# Patient Record
Sex: Male | Born: 1945 | Race: White | Hispanic: No | Smoking: Never smoker
Health system: Southern US, Community
[De-identification: ages and names within clinical notes are randomized; demographics above are authoritative.]

## PROBLEM LIST (undated history)

## (undated) DIAGNOSIS — E119 Type 2 diabetes mellitus without complications: Secondary | ICD-10-CM

## (undated) DIAGNOSIS — L219 Seborrheic dermatitis, unspecified: Secondary | ICD-10-CM

## (undated) DIAGNOSIS — E785 Hyperlipidemia, unspecified: Secondary | ICD-10-CM

## (undated) HISTORY — PX: LEG SURGERY: SHX1003

## (undated) HISTORY — DX: Seborrheic dermatitis, unspecified: L21.9

## (undated) HISTORY — PX: EYE SURGERY: SHX253

## (undated) HISTORY — DX: Type 2 diabetes mellitus without complications: E11.9

## (undated) HISTORY — PX: KNEE SURGERY: SHX244

---

## 2003-01-14 ENCOUNTER — Emergency Department (HOSPITAL_COMMUNITY): Admission: EM | Admit: 2003-01-14 | Discharge: 2003-01-14 | Payer: Self-pay

## 2004-10-21 ENCOUNTER — Emergency Department (HOSPITAL_COMMUNITY): Admission: EM | Admit: 2004-10-21 | Discharge: 2004-10-21 | Payer: Self-pay | Admitting: Family Medicine

## 2012-10-04 DEATH — deceased

## 2014-11-11 DIAGNOSIS — S82873A Displaced pilon fracture of unspecified tibia, initial encounter for closed fracture: Secondary | ICD-10-CM | POA: Insufficient documentation

## 2015-12-26 ENCOUNTER — Ambulatory Visit (INDEPENDENT_AMBULATORY_CARE_PROVIDER_SITE_OTHER): Payer: BLUE CROSS/BLUE SHIELD | Admitting: Family Medicine

## 2015-12-26 ENCOUNTER — Encounter: Payer: Self-pay | Admitting: Family Medicine

## 2015-12-26 VITALS — BP 121/78 | HR 74 | Ht 72.0 in | Wt 238.0 lb

## 2015-12-26 DIAGNOSIS — E1165 Type 2 diabetes mellitus with hyperglycemia: Secondary | ICD-10-CM | POA: Diagnosis not present

## 2015-12-26 DIAGNOSIS — IMO0002 Reserved for concepts with insufficient information to code with codable children: Secondary | ICD-10-CM | POA: Insufficient documentation

## 2015-12-26 DIAGNOSIS — E559 Vitamin D deficiency, unspecified: Secondary | ICD-10-CM | POA: Insufficient documentation

## 2015-12-26 DIAGNOSIS — IMO0001 Reserved for inherently not codable concepts without codable children: Secondary | ICD-10-CM

## 2015-12-26 LAB — COMPREHENSIVE METABOLIC PANEL
ALBUMIN: 4.2 g/dL (ref 3.6–5.1)
ALK PHOS: 83 U/L (ref 40–115)
ALT: 14 U/L (ref 9–46)
AST: 17 U/L (ref 10–35)
BILIRUBIN TOTAL: 0.6 mg/dL (ref 0.2–1.2)
BUN: 19 mg/dL (ref 7–25)
CO2: 28 mmol/L (ref 20–31)
CREATININE: 0.77 mg/dL (ref 0.70–1.25)
Calcium: 9.3 mg/dL (ref 8.6–10.3)
Chloride: 105 mmol/L (ref 98–110)
Glucose, Bld: 183 mg/dL — ABNORMAL HIGH (ref 65–99)
Potassium: 4.4 mmol/L (ref 3.5–5.3)
SODIUM: 141 mmol/L (ref 135–146)
TOTAL PROTEIN: 6.9 g/dL (ref 6.1–8.1)

## 2015-12-26 LAB — POCT UA - MICROALBUMIN
CREATININE, POC: 100 mg/dL
MICROALBUMIN (UR) POC: 10 mg/L

## 2015-12-26 LAB — CBC
HCT: 44.2 % (ref 38.5–50.0)
HEMOGLOBIN: 14.9 g/dL (ref 13.2–17.1)
MCH: 31.2 pg (ref 27.0–33.0)
MCHC: 33.7 g/dL (ref 32.0–36.0)
MCV: 92.7 fL (ref 80.0–100.0)
MPV: 12 fL (ref 7.5–12.5)
Platelets: 230 10*3/uL (ref 140–400)
RBC: 4.77 MIL/uL (ref 4.20–5.80)
RDW: 13.8 % (ref 11.0–15.0)
WBC: 7.9 10*3/uL (ref 3.8–10.8)

## 2015-12-26 LAB — LIPID PANEL
Cholesterol: 160 mg/dL (ref 125–200)
HDL: 44 mg/dL (ref >=40)
LDL CALC: 101 mg/dL (ref <130)
TRIGLYCERIDES: 75 mg/dL (ref <150)
Total CHOL/HDL Ratio: 3.6 Ratio (ref <=5.0)
VLDL: 15 mg/dL (ref <30)

## 2015-12-26 LAB — URIC ACID: Uric Acid, Serum: 4.1 mg/dL (ref 4.0–7.8)

## 2015-12-26 MED ORDER — DAPAGLIFLOZIN PRO-METFORMIN ER 10-1000 MG PO TB24: 1.0000 | ORAL_TABLET | Freq: Every day | ORAL | Status: DC

## 2015-12-26 NOTE — Patient Instructions (Signed)
Thank you for coming in today. Get labs today.  Return in 1-2 months.  We will likely add cholesterol medicines.  Call or go to the emergency room if you get worse, have trouble breathing, have chest pains, or palpitations.   Get diabetic eye exam as well yearly.

## 2015-12-26 NOTE — Assessment & Plan Note (Signed)
Check vitamin D levels today. 

## 2015-12-26 NOTE — Progress Notes (Signed)
Billy Armstrong is a 70 y.o. male who presents to [facility] today for establish care.  1) diabetes: Patient has type 2 diabetes. He notes his last A1c was 9.6 a few months ago. He denies any polyuria or polydipsia. He takes extended-release metformin 500 mg daily. He denies any significant foot numbness but does note hypersensitivity in his feet bilaterally. He gets annual diabetic eye exams last one was done less than a year ago.  2) lipids: Patient has been told he has high cholesterol in the past. He takes 1 g of omega-3 Fish oil over-the-counter daily.  3) health maintenance: Patient notes that he's had a colonoscopy about 8 years ago.   Past Medical History  Diagnosis Date  . Diabetes mellitus without complication [facility])    Past Surgical History  Procedure Laterality Date  . Leg surgery    . Eye surgery    . Knee surgery     Social History  Substance Use Topics  . Smoking status: Never Smoker   . Smokeless tobacco: Not on file  . Alcohol Use: 0.0 oz/week    0 Standard drinks or equivalent per week   family history is not on file.  ROS as above No headache, visual changes, nausea, vomiting, diarrhea, constipation, dizziness, abdominal pain, skin rash, fevers, chills, night sweats, weight loss, swollen lymph nodes, body aches, joint swelling, muscle aches, chest pain, shortness of breath, mood changes, visual or auditory hallucinations.   Medications: Current Outpatient Prescriptions  Medication Sig Dispense Refill  . aspirin 81 MG tablet Take 81 mg by mouth daily.    . Calcium Citrate-Vitamin D (CALCIUM + D PO) Take by mouth.    . Cholecalciferol (D3 ADULT PO) Take by mouth.    Marland Kitchen glucose blood (ONETOUCH VERIO) test strip     . ibuprofen (ADVIL,MOTRIN) 200 MG tablet Take 200 mg by mouth every 6 (six) hours as needed.    . Multiple Vitamins-Minerals (MULTIVITAMIN PO)  Take by mouth.    . Omega-3 Fatty Acids (FISH OIL PO) Take by mouth.    . Dapagliflozin-Metformin HCl ER (XIGDUO XR) 05-999 MG TB24 Take 1 tablet by mouth daily. 30 tablet 1   No current facility-administered medications for this visit.   No Known Allergies   Exam:  BP 121/78 mmHg  Pulse 74  Ht 6' (1.829 m)  Wt 238 lb (107.956 kg)  BMI 32.27 kg/m2 Gen: Well NAD HEENT: EOMI,  MMM Lungs: Normal work of breathing. CTABL Heart: RRR no MRG Abd: NABS, Soft. Nondistended, Nontender Exts: Brisk capillary refill, warm and well perfused.   Diabetic Foot Exam - Simple   Simple Foot Form  Diabetic Foot exam was performed with the following findings:  Yes 12/26/2015 10:10 AM  Visual Inspection  No deformities, no ulcerations, no other skin breakdown bilaterally:  Yes  Sensation Testing  Intact to touch and monofilament testing bilaterally:  Yes  Pulse Check  Posterior Tibialis and Dorsalis pulse intact bilaterally:  Yes  Comments       Results for orders placed or performed in visit on 12/26/15 (from the past 24 hour(s))  POCT UA - Microalbumin     Status: Normal   Collection Time: 12/26/15  9:45 AM  Result Value Ref Range   Microalbumin Ur, POC 10 mg/L   Creatinine, POC 100 mg/dL   Albumin/Creatinine Ratio, Urine, POC <30    No results found.   Please see individual assessment and plan  sections.

## 2015-12-26 NOTE — Assessment & Plan Note (Signed)
Increase to medications to xigduo Xr 05/999. Check basic fasting labs. Return in one to 2 months.

## 2015-12-27 ENCOUNTER — Encounter: Payer: Self-pay | Admitting: Family Medicine

## 2015-12-27 DIAGNOSIS — Z9189 Other specified personal risk factors, not elsewhere classified: Secondary | ICD-10-CM | POA: Insufficient documentation

## 2015-12-27 LAB — LIPID PANEL
CHOLESTEROL: 199 mg/dL (ref 0–200)
HDL: 59 mg/dL (ref 35–70)
LDL CALC: 132 mg/dL
Triglycerides: 38 mg/dL — AB (ref 40–160)

## 2015-12-27 LAB — BASIC METABOLIC PANEL
BUN: 17 mg/dL (ref 4–21)
Creatinine: 1 mg/dL (ref 0.6–1.3)
GLUCOSE: 218 mg/dL
POTASSIUM: 5.3 mmol/L (ref 3.4–5.3)
Sodium: 139 mmol/L (ref 137–147)

## 2015-12-27 LAB — VITAMIN D 25 HYDROXY (VIT D DEFICIENCY, FRACTURES): Vit D, 25-Hydroxy: 28 ng/mL — ABNORMAL LOW (ref 30–100)

## 2015-12-27 LAB — CBC AND DIFFERENTIAL: HEMOGLOBIN: 15.6 g/dL (ref 13.5–17.5)

## 2015-12-27 MED ORDER — ATORVASTATIN CALCIUM 40 MG PO TABS: 40.0000 mg | ORAL_TABLET | Freq: Every day | ORAL | Status: DC

## 2015-12-27 NOTE — Progress Notes (Signed)
Quick Note:  1) Vitamin D deficiency noted. Take 2000 units of vitamin D daily over-the-counter.  2) Your 10 year risk of heart attack and stroke is 25%. I recommend starting lipitor to reduce this risk further. I have called this medicine in to your pharmacy.  3) The labs look good otherwise. ______

## 2015-12-27 NOTE — Addendum Note (Signed)
Addended by: Gregor Hams on: 12/27/2015 07:55 AM   Modules accepted: Orders

## 2015-12-30 ENCOUNTER — Encounter: Payer: Self-pay | Admitting: Family Medicine

## 2015-12-30 DIAGNOSIS — E291 Testicular hypofunction: Secondary | ICD-10-CM | POA: Insufficient documentation

## 2016-01-05 ENCOUNTER — Encounter: Payer: Self-pay | Admitting: Family Medicine

## 2016-01-05 LAB — ALBUMIN: Albumin: 4.2

## 2016-01-05 LAB — TESTOSTERONE, FREE & TOTAL
Magnesium: 1.9
PSA, FREE: 0.83
TESTOSTERONE: 5.9

## 2016-01-05 LAB — LIPID PANEL: VLDL: 8 mg/dL

## 2016-02-15 ENCOUNTER — Other Ambulatory Visit: Payer: Self-pay | Admitting: Family Medicine

## 2016-02-27 ENCOUNTER — Ambulatory Visit (INDEPENDENT_AMBULATORY_CARE_PROVIDER_SITE_OTHER): Payer: BLUE CROSS/BLUE SHIELD | Admitting: Family Medicine

## 2016-02-27 ENCOUNTER — Ambulatory Visit (INDEPENDENT_AMBULATORY_CARE_PROVIDER_SITE_OTHER): Payer: BLUE CROSS/BLUE SHIELD

## 2016-02-27 ENCOUNTER — Encounter: Payer: Self-pay | Admitting: Family Medicine

## 2016-02-27 VITALS — BP 111/71 | HR 65 | Ht 72.0 in | Wt 224.0 lb

## 2016-02-27 DIAGNOSIS — M25572 Pain in left ankle and joints of left foot: Secondary | ICD-10-CM

## 2016-02-27 DIAGNOSIS — E119 Type 2 diabetes mellitus without complications: Secondary | ICD-10-CM | POA: Diagnosis not present

## 2016-02-27 DIAGNOSIS — Z23 Encounter for immunization: Secondary | ICD-10-CM

## 2016-02-27 LAB — POCT GLYCOSYLATED HEMOGLOBIN (HGB A1C): HEMOGLOBIN A1C: 7.2

## 2016-02-27 MED ORDER — ONETOUCH DELICA LANCETS 33G MISC: 1.0000 | Freq: Every day | 12 refills | Status: DC

## 2016-02-27 MED ORDER — ZOSTER VACCINE LIVE 19400 UNT/0.65ML ~~LOC~~ SUSR: 0.6500 mL | Freq: Once | SUBCUTANEOUS | 0 refills | Status: AC

## 2016-02-27 MED ORDER — DICLOFENAC SODIUM 1 % TD GEL: 2.0000 g | Freq: Four times a day (QID) | TRANSDERMAL | 11 refills | Status: DC

## 2016-02-27 MED ORDER — DAPAGLIFLOZIN PRO-METFORMIN ER 10-1000 MG PO TB24: 1.0000 | ORAL_TABLET | Freq: Every day | ORAL | 1 refills | Status: DC

## 2016-02-27 MED ORDER — AMBULATORY NON FORMULARY MEDICATION: 12 refills | Status: DC

## 2016-02-27 NOTE — Progress Notes (Signed)
Billy Armstrong is a 70 y.o. male who presents to [place]: [facility] today for 2 month follow up of diabetes.  Patient reports his blood sugar has ranged from 100-140 when he takes it first thing upon awakening.  He admits to polyuria since starting the Xigduo XR.  He denies polydipsia and numbness and tingling in his feet.    His only complaint today is of left lateral ankle pain that began several weeks ago.  He describes the pain as an ache that is more noticeable at night and improves with ambulation.  He denies known injury.  Denies fever, chills, and unintentional weight loss.    Health maintenance: Patient is due for Hepatitis C screening, tetanus vaccine, Zostavaz, and Pneumovax.  His last foot exam was at his last visit in May.  He is due for an eye exam in the coming months.   Past Medical History:  Diagnosis Date  . Diabetes mellitus without complication [facility])    Past Surgical History:  Procedure Laterality Date  . EYE SURGERY    . KNEE SURGERY    . LEG SURGERY     Social History  Substance Use Topics  . Smoking status: Never Smoker  . Smokeless tobacco: Not on file  . Alcohol use 0.0 oz/week   family history is not on file.  ROS as above:  Medications: Current Outpatient Prescriptions  Medication Sig Dispense Refill  . aspirin 81 MG tablet Take 81 mg by mouth daily.    Marland Kitchen atorvastatin (LIPITOR) 40 MG tablet TAKE 1 TABLET (40 MG TOTAL) BY MOUTH DAILY. 30 tablet 0  . Calcium Citrate-Vitamin D (CALCIUM + D PO) Take by mouth.    . Cholecalciferol (D3 ADULT PO) Take by mouth.    . Dapagliflozin-Metformin HCl ER (XIGDUO XR) 05-999 MG TB24 Take 1 tablet by mouth daily. 90 tablet 1  . glucose blood (ONETOUCH VERIO) test strip     . ibuprofen (ADVIL,MOTRIN) 200 MG tablet Take 200 mg by mouth every 6 (six) hours as needed.    . Multiple Vitamins-Minerals  (MULTIVITAMIN PO) Take by mouth.    . Omega-3 Fatty Acids (FISH OIL PO) Take by mouth.    . AMBULATORY NON FORMULARY MEDICATION One touch verio test strips. Test daily. E11.65 100 each 12  . diclofenac sodium (VOLTAREN) 1 % GEL Apply 2 g topically 4 (four) times daily. To affected joint. 100 g 11  . ONETOUCH DELICA LANCETS 99991111 MISC 1 Device by Does not apply route daily. 100 each 12  . Zoster Vaccine Live, PF, (ZOSTAVAX) 24401 UNT/0.65ML injection Inject 19,400 Units into the skin once. If given in pharmacy fax report to Dr Georgina Snell [phone] 1 each 0   No current facility-administered medications for this visit.    No Known Allergies   Exam:  BP 111/71   Pulse 65   Ht 6' (1.829 m)   Wt 224 lb (101.6 kg)   BMI 30.38 kg/m  Gen: Well NAD Lungs: Normal work of breathing. CTABL Heart: RRR no MRG Exts: Brisk capillary refill, warm and well perfused.  Left ankle: Mild joint effusion  Full range of motion Nontender Sensation intact to light touch.  Dorsalis pedis pulse 2+ Pain with resisted eversion of his foot.  Left ankle Xray: Degenerative changes of the tibiotalar joint without acute bony findings. Awaiting formal radiology review.  Results for orders placed or performed in visit on 02/27/16 (from the past 24 hour(s))  POCT HgB A1C     Status: None   Collection Time: 02/27/16  8:28 AM  Result Value Ref Range   Hemoglobin A1C 7.2    Dg Ankle Complete Left  Result Date: 02/27/2016 CLINICAL DATA:  Left lateral malleolus ankle pain with slight swelling without injury. EXAM: LEFT ANKLE COMPLETE - 3+ VIEW COMPARISON:  None. FINDINGS: No evidence for fracture. No subluxation or dislocation. Degenerative changes are seen at the tibiotalar joint with a prominent os trigonum. Prominent plantar spur arises from the calcaneal tuberosity. IMPRESSION: Degenerative changes without acute bony findings. Electronically Signed   By: Misty Stanley M.D.   On: 02/27/2016 08:43     Assessment and  Plan: 70 y.o. 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  Will treat with diclofenac gel.  Use ice, compression, and tylenol as needed for pain. The ankle pain is a new issue today.  Diabetes: POC A1c of 7.2.  Will continue current medications with a follow up in 3 months.  Continue weight loss.  Health Maintenance: Hepatitis C screening will be done at next appointment with labs.  Given tetanus shot and PCV 13 in the office.  Prescribed Zostovax for patient to fill at pharmacy.    Discussed warning signs or symptoms. Please see discharge instructions. Patient expresses understanding.

## 2016-02-27 NOTE — Patient Instructions (Signed)
Thank you for coming in today. Use the gel and ice and compression and tylenol as needed.  Return in 3 months.  Return sooner if needed.

## 2016-03-13 ENCOUNTER — Other Ambulatory Visit: Payer: Self-pay | Admitting: Family Medicine

## 2016-03-29 ENCOUNTER — Encounter: Payer: Self-pay | Admitting: Family Medicine

## 2016-04-21 ENCOUNTER — Other Ambulatory Visit: Payer: Self-pay | Admitting: Family Medicine

## 2016-05-21 ENCOUNTER — Other Ambulatory Visit: Payer: Self-pay | Admitting: Family Medicine

## 2016-05-29 ENCOUNTER — Ambulatory Visit (INDEPENDENT_AMBULATORY_CARE_PROVIDER_SITE_OTHER): Payer: BLUE CROSS/BLUE SHIELD | Admitting: Family Medicine

## 2016-05-29 VITALS — BP 118/62 | HR 61 | Wt 218.0 lb

## 2016-05-29 DIAGNOSIS — M25572 Pain in left ankle and joints of left foot: Secondary | ICD-10-CM | POA: Diagnosis not present

## 2016-05-29 DIAGNOSIS — E785 Hyperlipidemia, unspecified: Secondary | ICD-10-CM | POA: Insufficient documentation

## 2016-05-29 DIAGNOSIS — E119 Type 2 diabetes mellitus without complications: Secondary | ICD-10-CM

## 2016-05-29 DIAGNOSIS — Z1159 Encounter for screening for other viral diseases: Secondary | ICD-10-CM | POA: Diagnosis not present

## 2016-05-29 DIAGNOSIS — G8929 Other chronic pain: Secondary | ICD-10-CM

## 2016-05-29 LAB — COMPLETE METABOLIC PANEL WITH GFR
ALT: 15 U/L (ref 9–46)
AST: 17 U/L (ref 10–35)
Albumin: 4.4 g/dL (ref 3.6–5.1)
Alkaline Phosphatase: 76 U/L (ref 40–115)
BUN: 25 mg/dL (ref 7–25)
CHLORIDE: 104 mmol/L (ref 98–110)
CO2: 27 mmol/L (ref 20–31)
CREATININE: 0.77 mg/dL (ref 0.70–1.25)
Calcium: 10.1 mg/dL (ref 8.6–10.3)
GFR, Est Non African American: >89 mL/min (ref >=60)
Glucose, Bld: 123 mg/dL — ABNORMAL HIGH (ref 65–99)
POTASSIUM: 4.5 mmol/L (ref 3.5–5.3)
Sodium: 141 mmol/L (ref 135–146)
Total Bilirubin: 0.8 mg/dL (ref 0.2–1.2)
Total Protein: 7 g/dL (ref 6.1–8.1)

## 2016-05-29 LAB — LIPID PANEL
CHOL/HDL RATIO: 2 ratio (ref <=5.0)
CHOLESTEROL: 99 mg/dL — AB (ref 125–200)
HDL: 49 mg/dL (ref >=40)
LDL CALC: 40 mg/dL (ref <130)
TRIGLYCERIDES: 50 mg/dL (ref <150)
VLDL: 10 mg/dL (ref <30)

## 2016-05-29 LAB — CBC
HCT: 44.9 % (ref 38.5–50.0)
Hemoglobin: 15.1 g/dL (ref 13.2–17.1)
MCH: 31.7 pg (ref 27.0–33.0)
MCHC: 33.6 g/dL (ref 32.0–36.0)
MCV: 94.3 fL (ref 80.0–100.0)
MPV: 11.7 fL (ref 7.5–12.5)
PLATELETS: 224 10*3/uL (ref 140–400)
RBC: 4.76 MIL/uL (ref 4.20–5.80)
RDW: 13.8 % (ref 11.0–15.0)
WBC: 7.6 10*3/uL (ref 3.8–10.8)

## 2016-05-29 LAB — POCT GLYCOSYLATED HEMOGLOBIN (HGB A1C): Hemoglobin A1C: 6.7

## 2016-05-29 MED ORDER — ATORVASTATIN CALCIUM 40 MG PO TABS: 40.0000 mg | ORAL_TABLET | Freq: Every day | ORAL | 1 refills | Status: DC

## 2016-05-29 MED ORDER — ZOSTER VACCINE LIVE 19400 UNT/0.65ML ~~LOC~~ SUSR: 0.6500 mL | Freq: Once | SUBCUTANEOUS | 0 refills | Status: AC

## 2016-05-29 NOTE — Progress Notes (Signed)
Adeola Gegg is a 70 y.o. male who presents to [place]: [place] today for follow-up diabetes, hyperlipidemia, and left ankle pain.  Diabetes: Doing excellent with the below regimen. He denies any hyperglycemia or hypoglycemia polyuria or polydipsia. He takes his medications regularly.  Hyperlipidemia: Also doing well with atorvastatin. No chest pain rotation shortness of breath or significant muscle pain.  Left ankle pain: Much better with relative rest and diclofenac gel. His symptoms have improved considerably.   Past Medical History:  Diagnosis Date  . Diabetes mellitus without complication [facility])    Past Surgical History:  Procedure Laterality Date  . EYE SURGERY    . KNEE SURGERY    . LEG SURGERY     Social History  Substance Use Topics  . Smoking status: Never Smoker  . Smokeless tobacco: Not on file  . Alcohol use 0.0 oz/week   family history is not on file.  ROS as above:  Medications: Current Outpatient Prescriptions  Medication Sig Dispense Refill  . AMBULATORY NON FORMULARY MEDICATION One touch verio test strips. Test daily. E11.65 100 each 12  . aspirin 81 MG tablet Take 81 mg by mouth daily.    Marland Kitchen atorvastatin (LIPITOR) 40 MG tablet Take 1 tablet (40 mg total) by mouth daily. 90 tablet 1  . Calcium Citrate-Vitamin D (CALCIUM + D PO) Take by mouth.    . Cholecalciferol (D3 ADULT PO) Take by mouth.    . Dapagliflozin-Metformin HCl ER (XIGDUO XR) 05-999 MG TB24 Take 1 tablet by mouth daily. 90 tablet 1  . diclofenac sodium (VOLTAREN) 1 % GEL Apply 2 g topically 4 (four) times daily. To affected joint. 100 g 11  . glucose blood (ONETOUCH VERIO) test strip     . ibuprofen (ADVIL,MOTRIN) 200 MG tablet Take 200 mg by mouth every 6 (six) hours as needed.    . Multiple Vitamins-Minerals (MULTIVITAMIN PO) Take by mouth.    . Omega-3 Fatty Acids (FISH OIL  PO) Take by mouth.    [name] DELICA LANCETS 99991111 MISC 1 Device by Does not apply route daily. 100 each 12  . Zoster Vaccine Live, PF, (ZOSTAVAX) 16109 UNT/0.65ML injection Inject 19,400 Units into the skin once. If given in pharmacy fax report to Dr Georgina Snell [phone] 1 each 0   No current facility-administered medications for this visit.    No Known Allergies  Health Maintenance Health Maintenance  Topic Date Due  . Hepatitis C Screening  [birth year]  . ZOSTAVAX  08/13/2007  . OPHTHALMOLOGY EXAM  02/04/2016  . INFLUENZA VACCINE  03/06/2016  . HEMOGLOBIN A1C  08/29/2016  . FOOT EXAM  12/25/2016  . URINE MICROALBUMIN  12/25/2016  . PNA vac Low Risk Adult (2 of 2 - PPSV23) 02/26/2017  . COLONOSCOPY  08/06/2017  . TETANUS/TDAP  02/26/2026     Exam:  BP 118/62   Pulse 61   Wt 218 lb (98.9 kg)   BMI 29.57 kg/m  Gen: Well NAD HEENT: EOMI,  MMM Lungs: Normal work of breathing. CTABL Heart: RRR no MRG Abd: NABS, Soft. Nondistended, Nontender Exts: Brisk capillary refill, warm and well perfused.  Ankles bilaterally are nontender. Normal gait.   Results for orders placed or performed in visit on 05/29/16 (from the past 72 hour(s))  POCT HgB A1C     Status: None   Collection Time: 05/29/16  8:40 AM  Result Value Ref Range   Hemoglobin A1C 6.7  No results found.    Assessment and Plan: 70 y.o. male with Diabetes. Excellent control. Continue current regimen.  Hyperlipidemia: Check fasting lipid as well as other fasting labs.  Ankle pain: Doing well continue current regimen.  Health maintenance: Flu vaccine declined. Hepatitis C screening lab order today.   Orders Placed This Encounter  Procedures  . CBC  . COMPLETE METABOLIC PANEL WITH GFR  . Lipid panel  . Hepatitis C antibody  . POCT HgB A1C    Discussed warning signs or symptoms. Please see discharge instructions. Patient expresses understanding.

## 2016-05-29 NOTE — Patient Instructions (Signed)
Thank you for coming in today. You are doing very well.  Please get fasting labs today.  Return in 3 months for recheck or sooner if needed.   Please make sure the eye doctor send a copy of you eye exam to me. Fax [phone]

## 2016-05-30 LAB — HEPATITIS C ANTIBODY: HCV Ab: NEGATIVE

## 2016-06-19 LAB — HM DIABETES EYE EXAM

## 2016-08-06 ENCOUNTER — Emergency Department (INDEPENDENT_AMBULATORY_CARE_PROVIDER_SITE_OTHER)
Admission: EM | Admit: 2016-08-06 | Discharge: 2016-08-06 | Disposition: A | Discharge status: Home / Self Care | Payer: BLUE CROSS/BLUE SHIELD | Source: Home / Self Care | Attending: Family Medicine | Admitting: Family Medicine

## 2016-08-06 ENCOUNTER — Encounter: Payer: Self-pay | Admitting: *Deleted

## 2016-08-06 DIAGNOSIS — J069 Acute upper respiratory infection, unspecified: Secondary | ICD-10-CM | POA: Diagnosis not present

## 2016-08-06 HISTORY — DX: Hyperlipidemia, unspecified: E78.5

## 2016-08-06 LAB — POCT RAPID STREP A (OFFICE): Rapid Strep A Screen: NEGATIVE

## 2016-08-06 MED ORDER — DOXYCYCLINE HYCLATE 100 MG PO CAPS: 100.0000 mg | ORAL_CAPSULE | Freq: Two times a day (BID) | ORAL | 0 refills | Status: DC

## 2016-08-06 MED ORDER — BENZONATATE 200 MG PO CAPS: ORAL_CAPSULE | ORAL | 0 refills | Status: DC

## 2016-08-06 NOTE — Discharge Instructions (Addendum)
Take plain guaifenesin (1200mg  extended release tabs such as Mucinex) twice daily, with plenty of water, for cough and congestion.  May add Pseudoephedrine (30mg , one or two every 4 to 6 hours) for sinus congestion.  Get adequate rest.   May use Afrin nasal spray (or generic oxymetazoline) twice daily for about 5 days and then discontinue.  Also recommend using saline nasal spray several times daily and saline nasal irrigation (AYR is a common brand).  Use Flonase nasal spray each morning after using Afrin nasal spray and saline nasal irrigation. Try warm salt water gargles for sore throat.  Stop all antihistamines for now, and other non-prescription cough/cold preparations. Follow-up with family doctor if not improving about10 days.

## 2016-08-06 NOTE — ED Triage Notes (Signed)
Pt c/o nasal congestion, sinus pressure and sore throat x 3 days. Denies fever.

## 2016-08-06 NOTE — ED Provider Notes (Signed)
[facility]    CSN: [id number] Arrival date & time: 08/06/16  1107     History   Chief Complaint Chief Complaint  Patient presents with  . Nasal Congestion    HPI Billy Armstrong is a 71 y.o. male.   Patient complains of three day history of typical cold-like symptoms including mild sore throat, sinus congestion, headache, fatigue, and cough.  His sore throat became worse today, and he notes that he often coughs until he gags.   The history is provided by the patient.    Past Medical History:  Diagnosis Date  . Diabetes mellitus without complication ([place])   . Hyperlipidemia     Patient Active Problem List   Diagnosis Date Noted  . HLD (hyperlipidemia) 05/29/2016  . Left ankle pain 02/27/2016  . 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 Framingham cardiac risk >20% in next 10 years 12/27/2015  . Diabetes mellitus ([place]) 12/26/2015  . Vitamin D deficiency 12/26/2015  . Fracture of tibial plafond 11/11/2014    Past Surgical History:  Procedure Laterality Date  . EYE SURGERY    . KNEE SURGERY    . LEG SURGERY         Home Medications    Prior to Admission medications   Medication Sig Start Date End Date Taking? Authorizing Provider  AMBULATORY NON FORMULARY MEDICATION One touch verio test strips. Test daily. E11.65 02/27/16   Gregor Hams, MD  aspirin 81 MG tablet Take 81 mg by mouth daily.    Historical Provider, MD  atorvastatin (LIPITOR) 40 MG tablet Take 1 tablet (40 mg total) by mouth daily. 05/29/16   Gregor Hams, MD  benzonatate (TESSALON) 200 MG capsule Take one cap by mouth at bedtime as needed for cough.  May repeat in 4 to 6 hours 08/06/16   Kandra Nicolas, MD  Calcium Citrate-Vitamin D (CALCIUM + D PO) Take by mouth.    Historical Provider, MD  Cholecalciferol (D3 ADULT PO) Take by mouth.    Historical Provider, MD  Dapagliflozin-Metformin HCl ER (XIGDUO XR) 05-999 MG TB24 Take 1 tablet by mouth daily. 02/27/16   Gregor Hams, MD  diclofenac  sodium (VOLTAREN) 1 % GEL Apply 2 g topically 4 (four) times daily. To affected joint. 02/27/16   Gregor Hams, MD  doxycycline (VIBRAMYCIN) 100 MG capsule Take 1 capsule (100 mg total) by mouth 2 (two) times daily. Take with food. 08/06/16   Kandra Nicolas, MD  glucose blood (ONETOUCH VERIO) test strip  10/13/14   Historical Provider, MD  ibuprofen (ADVIL,MOTRIN) 200 MG tablet Take 200 mg by mouth every 6 (six) hours as needed.    Historical Provider, MD  Multiple Vitamins-Minerals (MULTIVITAMIN PO) Take by mouth.    Historical Provider, MD  Omega-3 Fatty Acids (FISH OIL PO) Take by mouth.    Historical Provider, MD  [facility] DELICA LANCETS 99991111 MISC 1 Device by Does not apply route daily. 02/27/16   Gregor Hams, MD    Family History Family History  Problem Relation Age of Onset  . Diabetes Mother   . Cancer Father   . Cancer Sister   . Cancer Brother     Social History Social History  Substance Use Topics  . Smoking status: Never Smoker  . Smokeless tobacco: Never Used  . Alcohol use 0.0 oz/week     Allergies   Patient has no known allergies.   Review of Systems Review of Systems + sore throat + cough  No pleuritic pain No wheezing + nasal congestion + post-nasal drainage No sinus pain/pressure No itchy/red eyes No earache No hemoptysis No SOB No fever, + chills/sweats No nausea No vomiting No abdominal pain No diarrhea No urinary symptoms No skin rash + fatigue No myalgias + headache Used OTC meds without relief   Physical Exam Triage Vital Signs ED Triage Vitals  Enc Vitals Group     BP 08/06/16 1258 110/69     Pulse Rate 08/06/16 1258 64     Resp 08/06/16 1258 18     Temp 08/06/16 1258 97.9 F (36.6 C)     Temp Source 08/06/16 1258 Oral     SpO2 08/06/16 1258 98 %     Weight 08/06/16 1259 220 lb (99.8 kg)     Height 08/06/16 1259 6' (1.829 m)     Head Circumference --      Peak Flow --      Pain Score 08/06/16 1300 0     Pain Loc --      Pain  Edu? --      Excl. in [place]? --    No data found.   Updated Vital Signs BP 110/69 (BP Location: Left Arm)   Pulse 64   Temp 97.9 F (36.6 C) (Oral)   Resp 18   Ht 6' (1.829 m)   Wt 220 lb (99.8 kg)   SpO2 98%   BMI 29.84 kg/m   Visual Acuity Right Eye Distance:   Left Eye Distance:   Bilateral Distance:    Right Eye Near:   Left Eye Near:    Bilateral Near:     Physical Exam Nursing notes and Vital Signs reviewed. Appearance:  Patient appears stated age, and in no acute distress Eyes:  Pupils are equal, round, and reactive to light and accomodation.  Extraocular movement is intact.  Conjunctivae are not inflamed  Ears:  Canals normal.  Tympanic membranes normal.  Nose:  Mildly congested turbinates.  No sinus tenderness.  Pharynx:  Uvula edematous. Neck:  Supple.  Tender enlarged posterior/lateral nodes are palpated bilaterally.  Tonsillar nodes are tender to palpation but not enlarged.  Lungs:  Clear to auscultation.  Breath sounds are equal.  Moving air well. Heart:  Regular rate and rhythm without murmurs, rubs, or gallops.  Abdomen:  Nontender without masses or hepatosplenomegaly.  Bowel sounds are present.  No CVA or flank tenderness.  Extremities:  No edema.  Skin:  No rash present.    UC Treatments / Results  Labs (all labs ordered are listed, but only abnormal results are displayed) Labs Reviewed  POCT RAPID STREP A (OFFICE) negative    EKG  EKG Interpretation None       Radiology No results found.  Procedures Procedures (including critical care time)  Medications Ordered in UC Medications - No data to display   Initial Impression / Assessment and Plan / UC Course  I have reviewed the triage vital signs and the nursing notes.  Pertinent labs & imaging results that were available during my care of the patient were reviewed by me and considered in my medical decision making (see chart for details).  Clinical Course   Cover for atypical  organisms with doxycycline 100mg  BID Prescription written for Benzonatate (Tessalon) to take at bedtime for night-time cough.  Take plain guaifenesin (1200mg  extended release tabs such as Mucinex) twice daily, with plenty of water, for cough and congestion.  May add Pseudoephedrine (30mg , one or two every 4  to 6 hours) for sinus congestion.  Get adequate rest.   May use Afrin nasal spray (or generic oxymetazoline) twice daily for about 5 days and then discontinue.  Also recommend using saline nasal spray several times daily and saline nasal irrigation (AYR is a common brand).  Use Flonase nasal spray each morning after using Afrin nasal spray and saline nasal irrigation. Try warm salt water gargles for sore throat.  Stop all antihistamines for now, and other non-prescription cough/cold preparations. Follow-up with family doctor if not improving about10 days.      Final Clinical Impressions(s) / UC Diagnoses   Final diagnoses:  Acute upper respiratory infection    New Prescriptions New Prescriptions   BENZONATATE (TESSALON) 200 MG CAPSULE    Take one cap by mouth at bedtime as needed for cough.  May repeat in 4 to 6 hours   DOXYCYCLINE (VIBRAMYCIN) 100 MG CAPSULE    Take 1 capsule (100 mg total) by mouth 2 (two) times daily. Take with food.     Kandra Nicolas, MD 08/20/16 [phone]

## 2016-08-28 ENCOUNTER — Ambulatory Visit (INDEPENDENT_AMBULATORY_CARE_PROVIDER_SITE_OTHER): Payer: BLUE CROSS/BLUE SHIELD | Admitting: Family Medicine

## 2016-08-28 ENCOUNTER — Encounter: Payer: Self-pay | Admitting: Family Medicine

## 2016-08-28 VITALS — BP 118/65 | HR 65 | Wt 215.0 lb

## 2016-08-28 DIAGNOSIS — E119 Type 2 diabetes mellitus without complications: Secondary | ICD-10-CM | POA: Diagnosis not present

## 2016-08-28 DIAGNOSIS — E785 Hyperlipidemia, unspecified: Secondary | ICD-10-CM

## 2016-08-28 DIAGNOSIS — E559 Vitamin D deficiency, unspecified: Secondary | ICD-10-CM | POA: Diagnosis not present

## 2016-08-28 LAB — POCT GLYCOSYLATED HEMOGLOBIN (HGB A1C): HEMOGLOBIN A1C: 7.2

## 2016-08-28 NOTE — Progress Notes (Signed)
Billy Armstrong is a 71 y.o. male who presents to [place] today for  Follow-up diabetes, hyperlipidemia, vitamin D deficiency.  Diabetes: Doing well with the below medications. Patient denies any polyuria or polydipsia. He notes he's been less adherent to his normal diet And exercise program over the holidays and thinks perhaps his A1c will be a little worse.  Hyperlipidemia: Patient takes aspirin, Lipitor, and fish oil daily. He denies muscle aches or pains or abdominal pain. Denies any chest pain palpitations or shortness of breath.  Vitamin D deficiency: Patient takes vitamin D daily   Past Medical History:  Diagnosis Date  . Diabetes mellitus without complication ([place])   . Hyperlipidemia    Past Surgical History:  Procedure Laterality Date  . EYE SURGERY    . KNEE SURGERY    . LEG SURGERY     Social History  Substance Use Topics  . Smoking status: Never Smoker  . Smokeless tobacco: Never Used  . Alcohol use 0.0 oz/week   family history includes Cancer in his brother, father, and sister; Diabetes in his mother.  ROS as above:  Medications: Current Outpatient Prescriptions  Medication Sig Dispense Refill  . AMBULATORY NON FORMULARY MEDICATION One touch verio test strips. Test daily. E11.65 100 each 12  . aspirin 81 MG tablet Take 81 mg by mouth daily.    Marland Kitchen atorvastatin (LIPITOR) 40 MG tablet Take 1 tablet (40 mg total) by mouth daily. 90 tablet 1  . Calcium Citrate-Vitamin D (CALCIUM + D PO) Take by mouth.    . Cholecalciferol (D3 ADULT PO) Take by mouth.    . Dapagliflozin-Metformin HCl ER (XIGDUO XR) 05-999 MG TB24 Take 1 tablet by mouth daily. 90 tablet 1  . diclofenac sodium (VOLTAREN) 1 % GEL Apply 2 g topically 4 (four) times daily. To affected joint. 100 g 11  . glucose blood (ONETOUCH VERIO) test strip     . ibuprofen (ADVIL,MOTRIN) 200  MG tablet Take 200 mg by mouth every 6 (six) hours as needed.    . Multiple Vitamins-Minerals (MULTIVITAMIN PO) Take by mouth.    . Omega-3 Fatty Acids (FISH OIL PO) Take by mouth.    [name] DELICA LANCETS 99991111 MISC 1 Device by Does not apply route daily. 100 each 12   No current facility-administered medications for this visit.    No Known Allergies  Health Maintenance Health Maintenance  Topic Date Due  . OPHTHALMOLOGY EXAM  02/04/2016  . INFLUENZA VACCINE  04/06/2029 (Originally 03/06/2016)  . FOOT EXAM  12/25/2016  . URINE MICROALBUMIN  12/25/2016  . HEMOGLOBIN A1C  02/25/2017  . PNA vac Low Risk Adult (2 of 2 - PPSV23) 02/26/2017  . COLONOSCOPY  08/06/2017  . TETANUS/TDAP  02/26/2026  . ZOSTAVAX  Completed  . Hepatitis C Screening  Completed     Exam:  BP 118/65   Pulse 65   Wt 215 lb (97.5 kg)   SpO2 99%   BMI 29.16 kg/m  Gen: Well NAD HEENT: EOMI,  MMM Lungs: Normal work of breathing. CTABL Heart: RRR no MRG Abd: NABS, Soft. Nondistended, Nontender Exts: Brisk capillary refill, warm and well perfused. Varicosities present bilaterally   Results for orders placed or performed in visit on 08/28/16 (from the past 72 hour(s))  POCT HgB A1C     Status: None   Collection Time: 08/28/16  8:18 AM  Result Value Ref Range   Hemoglobin A1C 7.2  No results found.    Assessment and Plan: 72 y.o. male with  Diabetes: Doing well. Patient had diabetic eye exam last fall. Patient will try to be a little more adherent to diet and exercise and will recheck A1c in 3 months.  Hyperlipidemia: Doing very well. Continue current regimen. Recheck in 3 months.  Vitamin D deficiency: Also doing well. Continue current regimen and recheck in 3 months.   Orders Placed This Encounter  Procedures  . POCT HgB A1C    Discussed warning signs or symptoms. Please see discharge instructions. Patient expresses understanding.

## 2016-08-28 NOTE — Patient Instructions (Signed)
Thank you for coming in today. Recheck in 3 months or sooner if needed.  Work on reduced carbs to keep the diabetes under control.

## 2016-09-04 ENCOUNTER — Other Ambulatory Visit: Payer: Self-pay | Admitting: Family Medicine

## 2016-09-18 ENCOUNTER — Encounter: Payer: Self-pay | Admitting: Family Medicine

## 2016-11-26 ENCOUNTER — Ambulatory Visit: Payer: BLUE CROSS/BLUE SHIELD | Admitting: Family Medicine

## 2016-12-03 ENCOUNTER — Ambulatory Visit (INDEPENDENT_AMBULATORY_CARE_PROVIDER_SITE_OTHER): Payer: BLUE CROSS/BLUE SHIELD | Admitting: Family Medicine

## 2016-12-03 VITALS — BP 153/74 | HR 68 | Wt 212.0 lb

## 2016-12-03 DIAGNOSIS — E119 Type 2 diabetes mellitus without complications: Secondary | ICD-10-CM | POA: Diagnosis not present

## 2016-12-03 LAB — POCT UA - MICROALBUMIN

## 2016-12-03 LAB — POCT GLYCOSYLATED HEMOGLOBIN (HGB A1C): HEMOGLOBIN A1C: 7.6

## 2016-12-03 NOTE — Progress Notes (Signed)
Billy Armstrong is a 71 y.o. male who presents to [place]: [facility] today for follow-up diabetes.  He has no problems with his current medications.  He has been measuring his fasting blood glucose at home once a day and notes it is normally above 130.  It used to be below this which concerns him.  He denies polyuria, polydipsia, changes in vision, SOB, palpitations. He has been walking for 20 minutes a day for exercise.     Past Medical History:  Diagnosis Date  . Diabetes mellitus without complication ([place])   . Hyperlipidemia    Past Surgical History:  Procedure Laterality Date  . EYE SURGERY    . KNEE SURGERY    . LEG SURGERY     Social History  Substance Use Topics  . Smoking status: Never Smoker  . Smokeless tobacco: Never Used  . Alcohol use 0.0 oz/week   family history includes Cancer in his brother, father, and sister; Diabetes in his mother.  ROS as above:  Medications: Current Outpatient Prescriptions  Medication Sig Dispense Refill  . AMBULATORY NON FORMULARY MEDICATION One touch verio test strips. Test daily. E11.65 100 each 12  . aspirin 81 MG tablet Take 81 mg by mouth daily.    Marland Kitchen atorvastatin (LIPITOR) 40 MG tablet Take 1 tablet (40 mg total) by mouth daily. 90 tablet 1  . Calcium Citrate-Vitamin D (CALCIUM + D PO) Take by mouth.    . Cholecalciferol (D3 ADULT PO) Take by mouth.    . diclofenac sodium (VOLTAREN) 1 % GEL Apply 2 g topically 4 (four) times daily. To affected joint. 100 g 11  . glucose blood (ONETOUCH VERIO) test strip     . ibuprofen (ADVIL,MOTRIN) 200 MG tablet Take 200 mg by mouth every 6 (six) hours as needed.    . Multiple Vitamins-Minerals (MULTIVITAMIN PO) Take by mouth.    . Omega-3 Fatty Acids (FISH OIL PO) Take by mouth.    [name] DELICA LANCETS 09F MISC 1 Device by Does not apply route daily. 100 each 12  . XIGDUO XR  05-999 MG TB24 TAKE 1 TABLET BY MOUTH DAILY. 90 tablet 1   No current facility-administered medications for this visit.    No Known Allergies  Health Maintenance Health Maintenance  Topic Date Due  . INFLUENZA VACCINE  04/06/2029 (Originally 03/06/2017)  . FOOT EXAM  12/25/2016  . URINE MICROALBUMIN  12/25/2016  . HEMOGLOBIN A1C  02/25/2017  . PNA vac Low Risk Adult (2 of 2 - PPSV23) 02/26/2017  . OPHTHALMOLOGY EXAM  06/19/2017  . COLONOSCOPY  08/06/2017  . TETANUS/TDAP  02/26/2026  . Hepatitis C Screening  Completed     Exam:  BP (!) 153/74   Pulse 68   Wt 212 lb (96.2 kg)   BMI 28.75 kg/m  Gen: Well NAD HEENT: EOMI,  MMM Lungs: Normal work of breathing. CTABL Heart: RRR no MRG Abd: NABS, Soft. Nondistended, Nontender Exts: Brisk capillary refill, warm and well perfused.    Results for orders placed or performed in visit on 12/03/16 (from the past 72 hour(s))  POCT UA - Microalbumin     Status: Normal   Collection Time: 12/03/16  9:12 AM  Result Value Ref Range   Microalbumin Ur, POC 10mg /l mg/L   Creatinine, POC 200mg  /dl mg/dL   Albumin/Creatinine Ratio, Urine, POC 30mg /g   POCT HgB A1C     Status: Abnormal   Collection Time:  12/03/16  9:13 AM  Result Value Ref Range   Hemoglobin A1C 7.6    No results found.    Assessment and Plan: 71 y.o. male with T2DM  Today's A1C was up from last visit, patient was reluctant to take more medication and would like to work on lifestyle changes.  He was advised on limiting carbohydrate intake and counseled on use of online tools for tracking his carb consumption with his blood glucose.  Urine microalbuminuria was in normal range.  His blood pressure was normal today (124/68).  Will consider low dose ACEi if renal protection required in the future.   Follow-up in 3 months.     Orders Placed This Encounter  Procedures  . POCT HgB A1C  . POCT UA - Microalbumin   No orders of the defined types were placed in this  encounter.    Discussed warning signs or symptoms. Please see discharge instructions. Patient expresses understanding.

## 2016-12-03 NOTE — Patient Instructions (Signed)
Thank you for coming in today. Work on reduced carbs.  Recheck in 3 months.  Consider getting Myfitnesspal

## 2016-12-08 ENCOUNTER — Other Ambulatory Visit: Payer: Self-pay | Admitting: Family Medicine

## 2016-12-25 ENCOUNTER — Ambulatory Visit (INDEPENDENT_AMBULATORY_CARE_PROVIDER_SITE_OTHER): Payer: BLUE CROSS/BLUE SHIELD | Admitting: Family Medicine

## 2016-12-25 ENCOUNTER — Encounter: Payer: Self-pay | Admitting: Family Medicine

## 2016-12-25 VITALS — BP 116/64 | HR 70 | Wt 218.0 lb

## 2016-12-25 DIAGNOSIS — K644 Residual hemorrhoidal skin tags: Secondary | ICD-10-CM | POA: Diagnosis not present

## 2016-12-25 MED ORDER — HYDROCORTISONE 2.5 % RE CREA: 1.0000 "application " | TOPICAL_CREAM | Freq: Two times a day (BID) | RECTAL | 2 refills | Status: DC

## 2016-12-25 NOTE — Patient Instructions (Signed)
Thank you for coming in today. Apply the cream 2-3x daily.  Take a warm bath at night.  Return as needed.  Make sure to eat plenty of fiber.    Hemorrhoids Hemorrhoids are swollen veins in and around the rectum or anus. There are two types of hemorrhoids:  Internal hemorrhoids. These occur in the veins that are just inside the rectum. They may poke through to the outside and become irritated and painful.  External hemorrhoids. These occur in the veins that are outside of the anus and can be felt as a painful swelling or hard lump near the anus. Most hemorrhoids do not cause serious problems, and they can be managed with home treatments such as diet and lifestyle changes. If home treatments do not help your symptoms, procedures can be done to shrink or remove the hemorrhoids. What are the causes? This condition is caused by increased pressure in the anal area. This pressure may result from various things, including:  Constipation.  Straining to have a bowel movement.  Diarrhea.  Pregnancy.  Obesity.  Sitting for long periods of time.  Heavy lifting or other activity that causes you to strain.  Anal sex. What are the signs or symptoms? Symptoms of this condition include:  Pain.  Anal itching or irritation.  Rectal bleeding.  Leakage of stool (feces).  Anal swelling.  One or more lumps around the anus. How is this diagnosed? This condition can often be diagnosed through a visual exam. Other exams or tests may also be done, such as:  Examination of the rectal area with a gloved hand (digital rectal exam).  Examination of the anal canal using a small tube (anoscope).  A blood test, if you have lost a significant amount of blood.  A test to look inside the colon (sigmoidoscopy or colonoscopy). How is this treated? This condition can usually be treated at home. However, various procedures may be done if dietary changes, lifestyle changes, and other home treatments do  not help your symptoms. These procedures can help make the hemorrhoids smaller or remove them completely. Some of these procedures involve surgery, and others do not. Common procedures include:  Rubber band ligation. Rubber bands are placed at the base of the hemorrhoids to cut off the blood supply to them.  Sclerotherapy. Medicine is injected into the hemorrhoids to shrink them.  Infrared coagulation. A type of light energy is used to get rid of the hemorrhoids.  Hemorrhoidectomy surgery. The hemorrhoids are surgically removed, and the veins that supply them are tied off.  Stapled hemorrhoidopexy surgery. A circular stapling device is used to remove the hemorrhoids and use staples to cut off the blood supply to them. Follow these instructions at home: Eating and drinking   Eat foods that have a lot of fiber in them, such as whole grains, beans, nuts, fruits, and vegetables. Ask your health care provider about taking products that have added fiber (fiber supplements).  Drink enough fluid to keep your urine clear or pale yellow. Managing pain and swelling   Take warm sitz baths for 20 minutes, 3-4 times a day to ease pain and discomfort.  If directed, apply ice to the affected area. Using ice packs between sitz baths may be helpful.  Put ice in a plastic bag.  Place a towel between your skin and the bag.  Leave the ice on for 20 minutes, 2-3 times a day. General instructions   Take over-the-counter and prescription medicines only as told by your health  care provider.  Use medicated creams or suppositories as told.  Exercise regularly.  Go to the bathroom when you have the urge to have a bowel movement. Do not wait.  Avoid straining to have bowel movements.  Keep the anal area dry and clean. Use wet toilet paper or moist towelettes after a bowel movement.  Do not sit on the toilet for long periods of time. This increases blood pooling and pain. Contact a health care provider  if:  You have increasing pain and swelling that are not controlled by treatment or medicine.  You have uncontrolled bleeding.  You have difficulty having a bowel movement, or you are unable to have a bowel movement.  You have pain or inflammation outside the area of the hemorrhoids. This information is not intended to replace advice given to you by your health care provider. Make sure you discuss any questions you have with your health care provider. Document Released: 07/20/2000 Document Revised: 12/21/2015 Document Reviewed: 04/06/2015 Elsevier Interactive Patient Education  2017 [employer].

## 2016-12-25 NOTE — Progress Notes (Signed)
Billy Armstrong is a 71 y.o. male who presents to [facility]: [facility] today for hemorrhoid. Patient has a painful itchy firm hemorrhoid present for about a week. He notes it bleed some. He's been using over-the-counter hemorrhoid cream. He denies any fevers or chills vomiting or diarrhea. Sometimes he has hard stools. He does not get hemorrhoids Frequently.    Past Medical History:  Diagnosis Date  . Diabetes mellitus without complication ([place])   . Hyperlipidemia    Past Surgical History:  Procedure Laterality Date  . EYE SURGERY    . KNEE SURGERY    . LEG SURGERY     Social History  Substance Use Topics  . Smoking status: Never Smoker  . Smokeless tobacco: Never Used  . Alcohol use 0.0 oz/week   family history includes Cancer in his brother, father, and sister; Diabetes in his mother.  ROS as above:  Medications: Current Outpatient Prescriptions  Medication Sig Dispense Refill  . AMBULATORY NON FORMULARY MEDICATION One touch verio test strips. Test daily. E11.65 100 each 12  . aspirin 81 MG tablet Take 81 mg by mouth daily.    Marland Kitchen atorvastatin (LIPITOR) 40 MG tablet TAKE 1 TABLET EVERY DAY 90 tablet 1  . Calcium Citrate-Vitamin D (CALCIUM + D PO) Take by mouth.    . Cholecalciferol (D3 ADULT PO) Take by mouth.    . diclofenac sodium (VOLTAREN) 1 % GEL Apply 2 g topically 4 (four) times daily. To affected joint. 100 g 11  . glucose blood (ONETOUCH VERIO) test strip     . ibuprofen (ADVIL,MOTRIN) 200 MG tablet Take 200 mg by mouth every 6 (six) hours as needed.    . Multiple Vitamins-Minerals (MULTIVITAMIN PO) Take by mouth.    . Omega-3 Fatty Acids (FISH OIL PO) Take by mouth.    [name] DELICA LANCETS 16X MISC 1 Device by Does not apply route daily. 100 each 12  . XIGDUO XR 05-999 MG TB24 TAKE 1 TABLET BY MOUTH DAILY. 90 tablet 1  . hydrocortisone (ANUSOL-HC)  2.5 % rectal cream Place 1 application rectally 2 (two) times daily. 30 g 2   No current facility-administered medications for this visit.    No Known Allergies  Health Maintenance Health Maintenance  Topic Date Due  . FOOT EXAM  12/25/2016  . INFLUENZA VACCINE  04/06/2029 (Originally 03/06/2017)  . PNA vac Low Risk Adult (2 of 2 - PPSV23) 02/26/2017  . HEMOGLOBIN A1C  06/04/2017  . OPHTHALMOLOGY EXAM  06/19/2017  . COLONOSCOPY  08/06/2017  . URINE MICROALBUMIN  12/03/2017  . TETANUS/TDAP  02/26/2026  . Hepatitis C Screening  Completed     Exam:  BP 116/64   Pulse 70   Wt 218 lb (98.9 kg)   BMI 29.57 kg/m  Gen: Well NAD HEENT: EOMI,  MMM Lungs: Normal work of breathing. CTABL Heart: RRR no MRG Abd: NABS, Soft. Nondistended, Nontender Exts: Brisk capillary refill, warm and well perfused.  Rectum: Small thrombosed mildly tender external hemorrhoid present. Otherwise normal-appearing   No results found for this or any previous visit (from the past 72 hour(s)). No results found.    Assessment and Plan: 71 y.o. male with thrombosed external hemorrhoid. Patient is outside the window for excision. Plan for Anusol cream watchful waiting as sitz baths. Recommend stool softener.  No orders of the defined types were placed in this encounter.  Meds ordered this encounter  Medications  . hydrocortisone (  ANUSOL-HC) 2.5 % rectal cream    Sig: Place 1 application rectally 2 (two) times daily.    Dispense:  30 g    Refill:  2     Discussed warning signs or symptoms. Please see discharge instructions. Patient expresses understanding.

## 2017-02-24 ENCOUNTER — Other Ambulatory Visit: Payer: Self-pay | Admitting: Family Medicine

## 2017-03-11 ENCOUNTER — Ambulatory Visit (INDEPENDENT_AMBULATORY_CARE_PROVIDER_SITE_OTHER): Payer: BLUE CROSS/BLUE SHIELD | Admitting: Family Medicine

## 2017-03-11 ENCOUNTER — Encounter: Payer: Self-pay | Admitting: Family Medicine

## 2017-03-11 VITALS — BP 106/68 | HR 62 | Ht 72.0 in | Wt 210.0 lb

## 2017-03-11 DIAGNOSIS — E663 Overweight: Secondary | ICD-10-CM

## 2017-03-11 DIAGNOSIS — E119 Type 2 diabetes mellitus without complications: Secondary | ICD-10-CM | POA: Diagnosis not present

## 2017-03-11 DIAGNOSIS — R0981 Nasal congestion: Secondary | ICD-10-CM

## 2017-03-11 DIAGNOSIS — Z23 Encounter for immunization: Secondary | ICD-10-CM | POA: Diagnosis not present

## 2017-03-11 LAB — POCT GLYCOSYLATED HEMOGLOBIN (HGB A1C): Hemoglobin A1C: 7.1

## 2017-03-11 MED ORDER — FLUTICASONE PROPIONATE 50 MCG/ACT NA SUSP: 2.0000 | Freq: Every day | NASAL | 2 refills | Status: DC

## 2017-03-11 MED ORDER — DAPAGLIFLOZIN PRO-METFORMIN ER 10-1000 MG PO TB24: 1.0000 | ORAL_TABLET | Freq: Every day | ORAL | 3 refills | Status: DC

## 2017-03-11 MED ORDER — ATORVASTATIN CALCIUM 40 MG PO TABS: 40.0000 mg | ORAL_TABLET | Freq: Every day | ORAL | 3 refills | Status: DC

## 2017-03-11 NOTE — Patient Instructions (Signed)
Thank you for coming in today. Recheck in 4-5 months for diabetes follow up.  We will likely get fasting labs then.

## 2017-03-11 NOTE — Progress Notes (Signed)
Billy Armstrong is a 71 y.o. male who presents to [name]: [name] today for follow-up of diabetes management.   Diabetes: Patient has continued making dietary adjustments, including decreasing his carbohydrate intake. He has not noticed any decreased sensation in his feet. Patient reports no problems with balance. Patient denies any recent changes in urinary frequency.   Sinus congestion: Patient reports sinus congestion for the last few months. He reports that these symptoms are worse on the right and worse when laying down at night. Patient reports that congestion occasionally wakes him up at night. Patient has tried benadryl which has helped improve symptoms. He also reports using saline drops, but is unsure whether these have improved his symptoms. He has not tried Claritin or Flonase. Patient denies any cough, fever, myalgias, or arthralgias.   Hemorrhoids: Patient reports that these have resolved with use of hydrocortisone cream and sitz baths.    Past Medical History:  Diagnosis Date  . Diabetes mellitus without complication ([place])   . Hyperlipidemia    Past Surgical History:  Procedure Laterality Date  . EYE SURGERY    . KNEE SURGERY    . LEG SURGERY     Social History  Substance Use Topics  . Smoking status: Never Smoker  . Smokeless tobacco: Never Used  . Alcohol use 0.0 oz/week   family history includes Cancer in his brother, father, and sister; Diabetes in his mother.  ROS as above:  Medications: Current Outpatient Prescriptions  Medication Sig Dispense Refill  . AMBULATORY NON FORMULARY MEDICATION One touch verio test strips. Test daily. E11.65 100 each 12  . aspirin 81 MG tablet Take 81 mg by mouth daily.    Marland Kitchen atorvastatin (LIPITOR) 40 MG tablet Take 1 tablet (40 mg total) by mouth daily. 90 tablet 3  . Calcium Citrate-Vitamin D (CALCIUM + D PO) Take  by mouth.    . Cholecalciferol (D3 ADULT PO) Take by mouth.    . Dapagliflozin-Metformin HCl ER (XIGDUO XR) 05-999 MG TB24 Take 1 tablet by mouth daily. 90 tablet 3  . diclofenac sodium (VOLTAREN) 1 % GEL Apply 2 g topically 4 (four) times daily. To affected joint. 100 g 11  . glucose blood (ONETOUCH VERIO) test strip     . hydrocortisone (ANUSOL-HC) 2.5 % rectal cream Place 1 application rectally 2 (two) times daily. 30 g 2  . ibuprofen (ADVIL,MOTRIN) 200 MG tablet Take 200 mg by mouth every 6 (six) hours as needed.    . Multiple Vitamins-Minerals (MULTIVITAMIN PO) Take by mouth.    . Omega-3 Fatty Acids (FISH OIL PO) Take by mouth.    [name] DELICA LANCETS 66Q MISC 1 Device by Does not apply route daily. 100 each 12  . fluticasone (FLONASE) 50 MCG/ACT nasal spray Place 2 sprays into both nostrils daily. 16 g 2   No current facility-administered medications for this visit.    No Known Allergies  Health Maintenance Health Maintenance  Topic Date Due  . INFLUENZA VACCINE  04/06/2029 (Originally 03/06/2017)  . OPHTHALMOLOGY EXAM  06/19/2017  . COLONOSCOPY  08/06/2017  . HEMOGLOBIN A1C  09/11/2017  . URINE MICROALBUMIN  12/03/2017  . FOOT EXAM  03/11/2018  . TETANUS/TDAP  02/26/2026  . Hepatitis C Screening  Completed  . PNA vac Low Risk Adult  Completed     Exam:  BP 106/68   Pulse 62   Ht 6' (1.829 m)   Wt 210 lb (95.3  kg)   BMI 28.48 kg/m  Gen: Well NAD HEENT: EOMI,  MMM, mild clear nasal discharge appreciated, no erythema or exudates seen in throat Lungs: Normal work of breathing. CTABL Heart: RRR, normal S1 and S2, no MRG Abd: NABS, Soft. Nondistended, Nontender Exts: Brisk capillary refill, warm and well perfused.  Foot exam: No ulcers or gross deformity present on inspection Dorsalis pedis pulses 2+ bilaterally, posterior tibialis pulses difficult to appreciate on exam No decreased sensation with monofilament testing No decreased proprioception    Results for  orders placed or performed in visit on 03/11/17 (from the past 72 hour(s))  POCT HgB A1C     Status: None   Collection Time: 03/11/17  8:37 AM  Result Value Ref Range   Hemoglobin A1C 7.1    No results found.    Assessment and Plan: 71 y.o. male seen for follow-up of diabetes management. Patient continues to make appropriate dietary changes, including decreasing his carbohydrate intake. He has lost 8 lbs since his last visit. His hemoglobin A1c is 7.1% today. He should continue the lifestyle modifications he has been making.   For nasal congestion, patient was instructed to try Flonase nasal spray. Patient was encouraged to follow-up in clinic if symptoms worsen or if he develops new symptoms such as fever.   Patient received pneumococcal polysaccharide-23 in clinic today. Patient will follow-up in 4-5 months or sooner if needed.    Orders Placed This Encounter  Procedures  . Pneumococcal polysaccharide vaccine 23-valent greater than or equal to 2yo subcutaneous/IM  . POCT HgB A1C   Meds ordered this encounter  Medications  . atorvastatin (LIPITOR) 40 MG tablet    Sig: Take 1 tablet (40 mg total) by mouth daily.    Dispense:  90 tablet    Refill:  3  . Dapagliflozin-Metformin HCl ER (XIGDUO XR) 05-999 MG TB24    Sig: Take 1 tablet by mouth daily.    Dispense:  90 tablet    Refill:  3  . fluticasone (FLONASE) 50 MCG/ACT nasal spray    Sig: Place 2 sprays into both nostrils daily.    Dispense:  16 g    Refill:  2     Discussed warning signs or symptoms. Please see discharge instructions. Patient expresses understanding.

## 2017-05-14 ENCOUNTER — Other Ambulatory Visit: Payer: Self-pay | Admitting: Family Medicine

## 2017-05-17 ENCOUNTER — Other Ambulatory Visit: Payer: Self-pay | Admitting: Family Medicine

## 2017-06-24 DIAGNOSIS — H43813 Vitreous degeneration, bilateral: Secondary | ICD-10-CM | POA: Insufficient documentation

## 2017-06-24 DIAGNOSIS — E119 Type 2 diabetes mellitus without complications: Secondary | ICD-10-CM | POA: Insufficient documentation

## 2017-06-24 LAB — HM DIABETES EYE EXAM

## 2017-07-08 ENCOUNTER — Encounter: Payer: Self-pay | Admitting: Family Medicine

## 2017-07-08 ENCOUNTER — Ambulatory Visit (INDEPENDENT_AMBULATORY_CARE_PROVIDER_SITE_OTHER): Payer: BLUE CROSS/BLUE SHIELD

## 2017-07-08 ENCOUNTER — Ambulatory Visit: Payer: BLUE CROSS/BLUE SHIELD | Admitting: Family Medicine

## 2017-07-08 VITALS — BP 96/65 | HR 65 | Wt 214.0 lb

## 2017-07-08 DIAGNOSIS — E559 Vitamin D deficiency, unspecified: Secondary | ICD-10-CM

## 2017-07-08 DIAGNOSIS — E119 Type 2 diabetes mellitus without complications: Secondary | ICD-10-CM | POA: Diagnosis not present

## 2017-07-08 DIAGNOSIS — Z1211 Encounter for screening for malignant neoplasm of colon: Secondary | ICD-10-CM | POA: Diagnosis not present

## 2017-07-08 DIAGNOSIS — R0789 Other chest pain: Secondary | ICD-10-CM

## 2017-07-08 DIAGNOSIS — E782 Mixed hyperlipidemia: Secondary | ICD-10-CM | POA: Diagnosis not present

## 2017-07-08 LAB — POCT GLYCOSYLATED HEMOGLOBIN (HGB A1C): Hemoglobin A1C: 7.5

## 2017-07-08 MED ORDER — AZELASTINE HCL 0.1 % NA SOLN: 2.0000 | Freq: Two times a day (BID) | NASAL | 12 refills | Status: DC

## 2017-07-08 MED ORDER — ONETOUCH DELICA LANCETS 33G MISC: 1.0000 | Freq: Every day | 3 refills | Status: DC

## 2017-07-08 MED ORDER — FLUTICASONE PROPIONATE 50 MCG/ACT NA SUSP: 2.0000 | Freq: Every day | NASAL | 2 refills | Status: DC

## 2017-07-08 NOTE — Patient Instructions (Addendum)
Thank you for coming in today. You should hear from [facility] center about colon cancer screening.  For diabetes please work on lower carb diet and increase exercise.  Recheck in 3 months.  Next step would Trulicity.   I think you have a trapezius spasm.  Get xray today to evaluate it.  Work on home exercises and use a heating pad.  See attached handout.  If not better let me know and I will order PT.   Try astelin nasal spray.  If not better let me know and I will refer to ENT.

## 2017-07-08 NOTE — Progress Notes (Signed)
Billy Armstrong is a 71 y.o. male who presents to [facility] today for diabetes follow up, and annual exam. Patient is overall doing well. Complains of shoulder pain, concern of weight gain, and running nose.  Diabetes: A1C 7.5, up from 7.1 last. Some weight gain from prior. Patient exercises frequently. Will try to watch diet around holidays and is confident he will get back on track after holidays. Discussed adding long acting injectable, but will maintain course for now.   Shoulder pain: patient has nagging left shoulder injury. Made worse by lifting heavy objects. He thinks he hurt arm 3 months ago while helping friend move. Feels that it is a nagging baseline pain. Motion doesn't necessarily make pain worse. Has tried ibuprofen with minimal relief. Patient has some ipsilateral lid lag. Low concern for pancoast, but with occupational dust exposure, some concern.   Nose congestion: left sided nose congestion. Flonase did not help. States that is is worse at night with lying on left side. Willing to try additional nasal spray, histamine pill before ENT consult.   Health maintenance: colon cancer screening due next year. Normal last time.   Past Medical History:  Diagnosis Date  . Diabetes mellitus without complication ([place])   . Hyperlipidemia    Past Surgical History:  Procedure Laterality Date  . EYE SURGERY    . KNEE SURGERY    . LEG SURGERY     Social History   Tobacco Use  . Smoking status: Never Smoker  . Smokeless tobacco: Never Used  Substance Use Topics  . Alcohol use: Yes    Alcohol/week: 0.0 oz   family history includes Cancer in his brother, father, and sister; Diabetes in his mother.  ROS as above:  Medications: Current Outpatient Medications  Medication Sig Dispense Refill  . AMBULATORY NON FORMULARY MEDICATION One touch verio test strips.  Test daily. E11.65 100 each 12  . aspirin 81 MG tablet Take 81 mg by mouth daily.    Marland Kitchen atorvastatin (LIPITOR) 40 MG tablet Take 1 tablet (40 mg total) by mouth daily. 90 tablet 3  . Calcium Citrate-Vitamin D (CALCIUM + D PO) Take by mouth.    . Cholecalciferol (D3 ADULT PO) Take by mouth.    . Dapagliflozin-Metformin HCl ER (XIGDUO XR) 05-999 MG TB24 Take 1 tablet by mouth daily. 90 tablet 3  . diclofenac sodium (VOLTAREN) 1 % GEL Apply 2 g topically 4 (four) times daily. To affected joint. 100 g 11  . fluticasone (FLONASE) 50 MCG/ACT nasal spray Place 2 sprays into both nostrils daily. 16 g 2  . hydrocortisone (ANUSOL-HC) 2.5 % rectal cream Place 1 application rectally 2 (two) times daily. 30 g 2  . ibuprofen (ADVIL,MOTRIN) 200 MG tablet Take 200 mg by mouth every 6 (six) hours as needed.    . Multiple Vitamins-Minerals (MULTIVITAMIN PO) Take by mouth.    . Omega-3 Fatty Acids (FISH OIL PO) Take by mouth.    [name] DELICA LANCETS 79G MISC 1 Device by Does not apply route daily. 100 each 3  . ONETOUCH VERIO test strip TEST DAILY 100 each 4  . azelastine (ASTELIN) 0.1 % nasal spray Place 2 sprays into both nostrils 2 (two) times daily. Use in each nostril as directed 30 mL 12   No current facility-administered medications for this visit.    No Known Allergies  Health Maintenance Health Maintenance  Topic Date Due  . OPHTHALMOLOGY EXAM  06/19/2017  . INFLUENZA VACCINE  04/06/2029 (Originally 03/06/2017)  . COLONOSCOPY  08/06/2017  . HEMOGLOBIN A1C  09/11/2017  . URINE MICROALBUMIN  12/03/2017  . FOOT EXAM  03/11/2018  . TETANUS/TDAP  02/26/2026  . Hepatitis C Screening  Completed  . PNA vac Low Risk Adult  Completed     Exam:  BP 96/65   Pulse 65   Wt 214 lb (97.1 kg)   BMI 29.02 kg/m  Gen: Well NAD HEENT: EOMI,  MMM Lungs: Normal work of breathing. CTABL Heart: RRR no MRG Abd: NABS, Soft. Nondistended, Nontender Exts: Brisk capillary refill, warm and well perfused.    MSK: Cspine non-tender to midline.  No masses felt.  Normal neck ROM.     Results for orders placed or performed in visit on 07/08/17 (from the past 72 hour(s))  POCT HgB A1C     Status: None   Collection Time: 07/08/17  8:09 AM  Result Value Ref Range   Hemoglobin A1C 7.5    Dg Chest 2 View  Result Date: 07/08/2017 CLINICAL DATA:  Left clavicular pain EXAM: CHEST  2 VIEW COMPARISON:  10/30/2014 FINDINGS: The cardiac shadow is within normal limits. The lungs are well aerated bilaterally. Mild degenerative changes of the acromioclavicular joints are seen bilaterally. Degenerative changes of the thoracic spine are noted as well. No acute abnormality noted. IMPRESSION: No active cardiopulmonary disease. Electronically Signed   By: Inez Catalina M.D.   On: 07/08/2017 09:03    Assessment and Plan: 71 y.o. male here for diabetes follow up and annual exam:  Diabetes: A1C up to 7.5 from 7.1. 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 Would like to try lifestyle changes before adding medication. Consider GLP1 or SGLT2 if continued to be above goal.   Shoulder pain: likely trapezial spasm exacerbated by heavy lifting. Gave home exercises and recommended heating pad. CXR to rule out pancoast. Highly unlikely, but worth ruling out with lid lag and occupational exposure.  Running nose: recommended OTC antihistamine. Prescribed astelin. Next step is ENT if these interventions fail.  Health maintenance: will get yearly labs as below. Referred to GI for colonoscopy.    Orders Placed This Encounter  Procedures  . DG Chest 2 View    Order Specific Question:   Reason for exam:    Answer:   Left trap and upper chest pain x 3 months    Order Specific Question:   Preferred imaging location?    Answer:   [facility]  . CBC  . COMPLETE METABOLIC PANEL WITH GFR  . Lipid Panel w/reflex Direct LDL  . PSA  . VITAMIN D 25 Hydroxy (Vit-D Deficiency, Fractures)  . Ambulatory referral  to Gastroenterology    Referral Priority:   Routine    Referral Type:   Consultation    Referral Reason:   Specialty Services Required    Requested Specialty:   Gastroenterology    Number of Visits Requested:   1  . POCT HgB A1C   Meds ordered this encounter  Medications  . ONETOUCH DELICA LANCETS 16X MISC    Sig: 1 Device by Does not apply route daily.    Dispense:  100 each    Refill:  3  . fluticasone (FLONASE) 50 MCG/ACT nasal spray    Sig: Place 2 sprays into both nostrils daily.    Dispense:  16 g    Refill:  2  . azelastine (ASTELIN) 0.1 % nasal spray    Sig: Place  2 sprays into both nostrils 2 (two) times daily. Use in each nostril as directed    Dispense:  30 mL    Refill:  12     Discussed warning signs or symptoms. Please see discharge instructions. Patient expresses understanding.

## 2017-07-09 LAB — LIPID PANEL W/REFLEX DIRECT LDL
Cholesterol: 104 mg/dL (ref <200)
HDL: 54 mg/dL (ref >40)
LDL CHOLESTEROL (CALC): 37 mg/dL
NON-HDL CHOLESTEROL (CALC): 50 mg/dL (ref <130)
Total CHOL/HDL Ratio: 1.9 (calc) (ref <5.0)
Triglycerides: 47 mg/dL (ref <150)

## 2017-07-09 LAB — CBC
HCT: 46.2 % (ref 38.5–50.0)
HEMOGLOBIN: 15.8 g/dL (ref 13.2–17.1)
MCH: 31.7 pg (ref 27.0–33.0)
MCHC: 34.2 g/dL (ref 32.0–36.0)
MCV: 92.8 fL (ref 80.0–100.0)
MPV: 11.6 fL (ref 7.5–12.5)
PLATELETS: 213 10*3/uL (ref 140–400)
RBC: 4.98 10*6/uL (ref 4.20–5.80)
RDW: 12.2 % (ref 11.0–15.0)
WBC: 7.2 10*3/uL (ref 3.8–10.8)

## 2017-07-09 LAB — COMPLETE METABOLIC PANEL WITH GFR
AG RATIO: 1.7 (calc) (ref 1.0–2.5)
ALBUMIN MSPROF: 4.5 g/dL (ref 3.6–5.1)
ALT: 17 U/L (ref 9–46)
AST: 18 U/L (ref 10–35)
Alkaline phosphatase (APISO): 91 U/L (ref 40–115)
BUN: 23 mg/dL (ref 7–25)
CALCIUM: 9.8 mg/dL (ref 8.6–10.3)
CO2: 27 mmol/L (ref 20–32)
Chloride: 105 mmol/L (ref 98–110)
Creat: 0.9 mg/dL (ref 0.70–1.25)
GFR, EST AFRICAN AMERICAN: 101 mL/min/{1.73_m2} (ref >=60)
GFR, EST NON AFRICAN AMERICAN: 87 mL/min/{1.73_m2} (ref >=60)
Globulin: 2.6 g/dL (calc) (ref 1.9–3.7)
Glucose, Bld: 163 mg/dL — ABNORMAL HIGH (ref 65–99)
Potassium: 4.5 mmol/L (ref 3.5–5.3)
Sodium: 141 mmol/L (ref 135–146)
TOTAL PROTEIN: 7.1 g/dL (ref 6.1–8.1)
Total Bilirubin: 0.6 mg/dL (ref 0.2–1.2)

## 2017-07-09 LAB — PSA: PSA: 0.9 ng/mL (ref <=4.0)

## 2017-07-09 LAB — VITAMIN D 25 HYDROXY (VIT D DEFICIENCY, FRACTURES): Vit D, 25-Hydroxy: 42 ng/mL (ref 30–100)

## 2017-08-23 LAB — HM COLONOSCOPY

## 2017-10-07 ENCOUNTER — Encounter: Payer: Self-pay | Admitting: Family Medicine

## 2017-10-07 ENCOUNTER — Ambulatory Visit: Payer: BLUE CROSS/BLUE SHIELD | Admitting: Family Medicine

## 2017-10-07 VITALS — BP 102/69 | HR 73 | Ht 72.0 in | Wt 216.0 lb

## 2017-10-07 DIAGNOSIS — E1165 Type 2 diabetes mellitus with hyperglycemia: Secondary | ICD-10-CM

## 2017-10-07 DIAGNOSIS — R0981 Nasal congestion: Secondary | ICD-10-CM | POA: Diagnosis not present

## 2017-10-07 DIAGNOSIS — E119 Type 2 diabetes mellitus without complications: Secondary | ICD-10-CM

## 2017-10-07 DIAGNOSIS — E782 Mixed hyperlipidemia: Secondary | ICD-10-CM

## 2017-10-07 LAB — POCT GLYCOSYLATED HEMOGLOBIN (HGB A1C): Hemoglobin A1C: 8.7

## 2017-10-07 MED ORDER — AMBULATORY NON FORMULARY MEDICATION: 12 refills | Status: DC

## 2017-10-07 NOTE — Progress Notes (Signed)
Billy Armstrong is a 72 y.o. male who presents to [facility] today for diabetes, nasal congestion, shoulder pain, hyperlipidemia.  Diabetes: Billy Armstrong has a history of diabetes which previously was well controlled with Leverne Humbles are listed below.  He notes his blood sugars fasting have recently been 140-190 which is more elevated than usual.  He continues to eat a careful diet avoiding carbohydrates if possible.  He notes he has been exercising less than usual recently because of the poor weather.  He would like to avoid adding medications if possible and wishes to try lifestyle changes if possible.  Nasal congestion: Billy Armstrong notes continued bothersome nasal congestion especially around bedtime.  He has had trials of Flonase nasal spray, Astelin nasal spray, and oral antihistamines which have not improved.  He finds his nasal congestion very bothersome and is interested in ENT referral if possible.  Shoulder pain: Billy Armstrong was seen 3 months ago for left shoulder pain which has now completely resolved and is asymptomatic.  He feels great.  Hyperlipidemia: Billy Armstrong takes atorvastatin daily for hyperlipidemia and denies significant muscle aches or pains.  Past Medical History:  Diagnosis Date  . Diabetes mellitus without complication ([place])   . Hyperlipidemia    Past Surgical History:  Procedure Laterality Date  . EYE SURGERY    . KNEE SURGERY    . LEG SURGERY     Social History   Tobacco Use  . Smoking status: Never Smoker  . Smokeless tobacco: Never Used  Substance Use Topics  . Alcohol use: Yes    Alcohol/week: 0.0 oz   family history includes Cancer in his brother, father, and sister; Diabetes in his mother.  ROS as above:  Medications: Current Outpatient Medications  Medication Sig Dispense Refill  . AMBULATORY NON FORMULARY MEDICATION Accu-Check  Guide test strips for 3x  daily testing.  Include also lancets for meter.  Disp qs x 3 months.  E11.65 Uncontrolled DM 100 each 12  . aspirin 81 MG tablet Take 81 mg by mouth daily.    Marland Kitchen atorvastatin (LIPITOR) 40 MG tablet Take 1 tablet (40 mg total) by mouth daily. 90 tablet 3  . azelastine (ASTELIN) 0.1 % nasal spray Place 2 sprays into both nostrils 2 (two) times daily. Use in each nostril as directed 30 mL 12  . Calcium Citrate-Vitamin D (CALCIUM + D PO) Take by mouth.    . Cholecalciferol (D3 ADULT PO) Take by mouth.    . Dapagliflozin-Metformin HCl ER (XIGDUO XR) 05-999 MG TB24 Take 1 tablet by mouth daily. 90 tablet 3  . diclofenac sodium (VOLTAREN) 1 % GEL Apply 2 g topically 4 (four) times daily. To affected joint. 100 g 11  . fluticasone (FLONASE) 50 MCG/ACT nasal spray Place 2 sprays into both nostrils daily. 16 g 2  . hydrocortisone (ANUSOL-HC) 2.5 % rectal cream Place 1 application rectally 2 (two) times daily. 30 g 2  . ibuprofen (ADVIL,MOTRIN) 200 MG tablet Take 200 mg by mouth every 6 (six) hours as needed.    . Multiple Vitamins-Minerals (MULTIVITAMIN PO) Take by mouth.    . Omega-3 Fatty Acids (FISH OIL PO) Take by mouth.     No current facility-administered medications for this visit.    No Known Allergies  Health Maintenance Health Maintenance  Topic Date Due  . OPHTHALMOLOGY EXAM  06/19/2017  . COLONOSCOPY  08/06/2017  . INFLUENZA VACCINE  04/06/2029 (Originally 03/06/2017)  . URINE MICROALBUMIN  12/03/2017  . HEMOGLOBIN A1C  01/06/2018  . FOOT EXAM  03/11/2018  . TETANUS/TDAP  02/26/2026  . Hepatitis C Screening  Completed  . PNA vac Low Risk Adult  Completed     Exam:  BP 102/69   Pulse 73   Ht 6' (1.829 m)   Wt 216 lb (98 kg)   BMI 29.29 kg/m   Wt Readings from Last 5 Encounters:  10/07/17 216 lb (98 kg)  07/08/17 214 lb (97.1 kg)  03/11/17 210 lb (95.3 kg)  12/25/16 218 lb (98.9 kg)  12/03/16 212 lb (96.2 kg)    Gen: Well NAD HEENT: EOMI,  MMM inflamed nasal  turbinates bilaterally.  Nontender maxillary sinus. Lungs: Normal work of breathing. CTABL Heart: RRR no MRG Abd: NABS, Soft. Nondistended, Nontender Exts: Brisk capillary refill, warm and well perfused.  MSK: Left shoulder normal-appearing nontender normal motion   Results for orders placed or performed in visit on 10/07/17 (from the past 72 hour(s))  POCT HgB A1C     Status: None   Collection Time: 10/07/17  8:18 AM  Result Value Ref Range   Hemoglobin A1C 8.7    No results found.    Assessment and Plan: 72 y.o. male with  Diabetes not well controlled.  Patient would like to try lifestyle change.  Plan for increased exercise and 3 times a day home blood sugar log.  Will report back blood sugars in 4-6 weeks.  Plan for phone recheck in 6 weeks.  If not obviously well controlled next step would likely be Trulicity.  Otherwise recheck with me in 3 months.  Nasal congestion: Persistent nighttime congestion despite adequate trial of typical medical conservative management.  At this time is reasonable to refer to ENT.  Referral placed  Shoulder pain resolved  Hyperlipidemia: Doing well at last recheck.  Continue current regimen.  Check basic fasting labs in the near future.   Orders Placed This Encounter  Procedures  . Ambulatory referral to ENT    Referral Priority:   Routine    Referral Type:   Consultation    Referral Reason:   Specialty Services Required    Requested Specialty:   Otolaryngology    Number of Visits Requested:   1  . POCT HgB A1C   Meds ordered this encounter  Medications  . AMBULATORY NON FORMULARY MEDICATION    Sig: Accu-Check  Guide test strips for 3x daily testing.  Include also lancets for meter.  Disp qs x 3 months.  E11.65 Uncontrolled DM    Dispense:  100 each    Refill:  12     Discussed warning signs or symptoms. Please see discharge instructions. Patient expresses understanding.

## 2017-10-07 NOTE — Patient Instructions (Addendum)
Thank you for coming in today. Continue Xigduo daily Work on increasing exercise.  Try to get 30 mins most of the days of the week.  Continue careful diet.  Test 3x daily and send results to me in 4-6 weeks.  We will together make a decision about a 2nd medicine.  Next step is likely Trulicity.   You should hear from ENT soon.  Let me know if you do not hear anything.      Dulaglutide injection What is this medicine? DULAGLUTIDE (DOO la GLOO tide) is used to improve blood sugar control in adults with type 2 diabetes. This medicine may be used with other oral diabetes medicines. This medicine may be used for other purposes; ask your health care provider or pharmacist if you have questions. COMMON BRAND NAME(S): TRULICITY What should I tell my health care provider before I take this medicine? They need to know if you have any of these conditions: -endocrine tumors (MEN 2) or if someone in your family had these tumors -history of pancreatitis -kidney disease -liver disease -stomach problems -thyroid cancer or if someone in your family had thyroid cancer -an unusual or allergic reaction to dulaglutide, other medicines, foods, dyes, or preservatives -pregnant or trying to get pregnant -breast-feeding How should I use this medicine? This medicine is for injection under the skin of your upper leg (thigh), stomach area, or upper arm. It is usually given once every week (every 7 days). You will be taught how to prepare and give this medicine. Use exactly as directed. Take your medicine at regular intervals. Do not take it more often than directed. If you use this medicine with insulin, you should inject this medicine and the insulin separately. Do not mix them together. Do not give the injections right next to each other. Change (rotate) injection sites with each injection. It is important that you put your used needles and syringes in a special sharps container. Do not put them in a trash  can. If you do not have a sharps container, call your pharmacist or healthcare provider to get one. A special MedGuide will be given to you by the pharmacist with each prescription and refill. Be sure to read this information carefully each time. Talk to your pediatrician regarding the use of this medicine in children. Special care may be needed. Overdosage: If you think you have taken too much of this medicine contact a poison control center or emergency room at once. NOTE: This medicine is only for you. Do not share this medicine with others. What if I miss a dose? If you miss a dose, take it as soon as you can within 3 days after the missed dose. Then take your next dose at your regular weekly time. If it has been longer than 3 days after the missed dose, do not take the missed dose. Take the next dose at your regular time. Do not take double or extra doses. If you have questions about a missed dose, contact your health care provider for advice. What may interact with this medicine? -other medicines for diabetes Many medications may cause changes in blood sugar, these include: -alcohol containing beverages -antiviral medicines for HIV or AIDS -aspirin and aspirin-like drugs -certain medicines for blood pressure, heart disease, irregular heart beat -chromium -diuretics -male hormones, such as estrogens or progestins, birth control pills -fenofibrate -gemfibrozil -isoniazid -lanreotide -male hormones or anabolic steroids -MAOIs like Carbex, Eldepryl, Marplan, Nardil, and Parnate -medicines for weight loss -medicines for allergies, asthma,  cold, or cough -medicines for depression, anxiety, or psychotic disturbances -niacin -nicotine -NSAIDs, medicines for pain and inflammation, like ibuprofen or naproxen -octreotide -pasireotide -pentamidine -phenytoin -probenecid -quinolone antibiotics such as ciprofloxacin, levofloxacin, ofloxacin -some herbal dietary supplements -steroid  medicines such as prednisone or cortisone -sulfamethoxazole; trimethoprim -thyroid hormones Some medications can hide the warning symptoms of low blood sugar (hypoglycemia). You may need to monitor your blood sugar more closely if you are taking one of these medications. These include: -beta-blockers, often used for high blood pressure or heart problems (examples include atenolol, metoprolol, propranolol) -clonidine -guanethidine -reserpine This list may not describe all possible interactions. Give your health care provider a list of all the medicines, herbs, non-prescription drugs, or dietary supplements you use. Also tell them if you smoke, drink alcohol, or use illegal drugs. Some items may interact with your medicine. What should I watch for while using this medicine? Visit your doctor or health care professional for regular checks on your progress. Drink plenty of fluids while taking this medicine. Check with your doctor or health care professional if you get an attack of severe diarrhea, nausea, and vomiting. The loss of too much body fluid can make it dangerous for you to take this medicine. A test called the HbA1C (A1C) will be monitored. This is a simple blood test. It measures your blood sugar control over the last 2 to 3 months. You will receive this test every 3 to 6 months. Learn how to check your blood sugar. Learn the symptoms of low and high blood sugar and how to manage them. Always carry a quick-source of sugar with you in case you have symptoms of low blood sugar. Examples include hard sugar candy or glucose tablets. Make sure others know that you can choke if you eat or drink when you develop serious symptoms of low blood sugar, such as seizures or unconsciousness. They must get medical help at once. Tell your doctor or health care professional if you have high blood sugar. You might need to change the dose of your medicine. If you are sick or exercising more than usual, you might  need to change the dose of your medicine. Do not skip meals. Ask your doctor or health care professional if you should avoid alcohol. Many nonprescription cough and cold products contain sugar or alcohol. These can affect blood sugar. Pens should never be shared. Even if the needle is changed, sharing may result in passing of viruses like hepatitis or HIV. Wear a medical ID bracelet or chain, and carry a card that describes your disease and details of your medicine and dosage times. What side effects may I notice from receiving this medicine? Side effects that you should report to your doctor or health care professional as soon as possible: -allergic reactions like skin rash, itching or hives, swelling of the face, lips, or tongue -breathing problems -diarrhea that continues or is severe -lump or swelling on the neck -severe nausea -signs and symptoms of infection like fever or chills; cough; sore throat; pain or trouble passing urine -signs and symptoms of low blood sugar such as feeling anxious, confusion, dizziness, increased hunger, unusually weak or tired, sweating, shakiness, cold, irritable, headache, blurred vision, fast heartbeat, loss of consciousness -signs and symptoms of kidney injury like trouble passing urine or change in the amount of urine -trouble swallowing -unusual stomach upset or pain -vomiting Side effects that usually do not require medical attention (report to your doctor or health care professional if they continue  or are bothersome): -diarrhea -loss of appetite -nausea -pain, redness, or irritation at site where injected -stomach upset This list may not describe all possible side effects. Call your doctor for medical advice about side effects. You may report side effects to FDA at 1-800-FDA-1088. Where should I keep my medicine? Keep out of the reach of children. Store unopened pens in a refrigerator between 2 and 8 degrees C (36 and 46 degrees F). Do not freeze  or use if the medicine has been frozen. Protect from light and excessive heat. Store in the carton until use. Each single-dose pen can be kept at room temperature, not to exceed 30 degrees C (86 degrees F) for a total of 14 days, if needed. Throw away any unused medicine after the expiration date on the label. NOTE: This sheet is a summary. It may not cover all possible information. If you have questions about this medicine, talk to your doctor, pharmacist, or health care provider.  2018 [employer]vier/Gold Standard (2016-08-09 14:35:01)

## 2017-10-17 ENCOUNTER — Ambulatory Visit (INDEPENDENT_AMBULATORY_CARE_PROVIDER_SITE_OTHER): Payer: BLUE CROSS/BLUE SHIELD | Admitting: Family Medicine

## 2017-10-17 ENCOUNTER — Encounter: Payer: Self-pay | Admitting: Family Medicine

## 2017-10-17 VITALS — BP 113/71 | HR 61 | Ht 72.0 in | Wt 222.0 lb

## 2017-10-17 DIAGNOSIS — M25572 Pain in left ankle and joints of left foot: Secondary | ICD-10-CM | POA: Diagnosis not present

## 2017-10-17 DIAGNOSIS — E1165 Type 2 diabetes mellitus with hyperglycemia: Secondary | ICD-10-CM

## 2017-10-17 NOTE — Patient Instructions (Addendum)
Thank you for coming in today. Continue the gel as needed.  If not better use the boot as needed.  Recheck if not better or if worse.

## 2017-10-17 NOTE — Progress Notes (Signed)
Billy Armstrong is a 72 y.o. male who presents to [facility] today for left ankle pain.  Billy Armstrong was in his normal state of health a few days ago. He stepped out of bed and felt a pop and pain in his left anterior ankle. He developed swelling. The pain is much improved over the last few days. He notes that this morning he was feeling well enough that he almost canceled his visit. He does continue to have some mild pain in his left anterior ankle. He denies any locking or catching or lack of ROM. He has not tried any treatment today but did take some OTC medication for pain as needed.   Diabetes: Billy Armstrong was seen a few weeks for diabetes. He planned on increasing exercise to bring his A1c in goal. He notes that his fasting blood sugars are no longer in the 190s max and now 125max. Typically his blood sugar remains >140.   Past Medical History:  Diagnosis Date  . Diabetes mellitus without complication ([place])   . Hyperlipidemia    Past Surgical History:  Procedure Laterality Date  . EYE SURGERY    . KNEE SURGERY    . LEG SURGERY     Social History   Tobacco Use  . Smoking status: Never Smoker  . Smokeless tobacco: Never Used  Substance Use Topics  . Alcohol use: Yes    Alcohol/week: 0.0 oz     ROS:  As above   Medications: Current Outpatient Medications  Medication Sig Dispense Refill  . AMBULATORY NON FORMULARY MEDICATION Accu-Check  Guide test strips for 3x daily testing.  Include also lancets for meter.  Disp qs x 3 months.  E11.65 Uncontrolled DM 100 each 12  . aspirin 81 MG tablet Take 81 mg by mouth daily.    Marland Kitchen atorvastatin (LIPITOR) 40 MG tablet Take 1 tablet (40 mg total) by mouth daily. 90 tablet 3  . azelastine (ASTELIN) 0.1 % nasal spray Place 2 sprays into both nostrils 2 (two) times daily. Use in each nostril as directed 30 mL 12  . Calcium Citrate-Vitamin D (CALCIUM + D PO) Take by mouth.    . Cholecalciferol (D3 ADULT PO)  Take by mouth.    . Dapagliflozin-Metformin HCl ER (XIGDUO XR) 05-999 MG TB24 Take 1 tablet by mouth daily. 90 tablet 3  . diclofenac sodium (VOLTAREN) 1 % GEL Apply 2 g topically 4 (four) times daily. To affected joint. 100 g 11  . fluticasone (FLONASE) 50 MCG/ACT nasal spray Place 2 sprays into both nostrils daily. 16 g 2  . hydrocortisone (ANUSOL-HC) 2.5 % rectal cream Place 1 application rectally 2 (two) times daily. 30 g 2  . ibuprofen (ADVIL,MOTRIN) 200 MG tablet Take 200 mg by mouth every 6 (six) hours as needed.    . Multiple Vitamins-Minerals (MULTIVITAMIN PO) Take by mouth.    . Omega-3 Fatty Acids (FISH OIL PO) Take by mouth.     No current facility-administered medications for this visit.    No Known Allergies   Exam:  BP 113/71   Pulse 61   Ht 6' (1.829 m)   Wt 222 lb (100.7 kg)   BMI 30.11 kg/m  General: Well Developed, well nourished, and in no acute distress.  Neuro/Psych: Alert and oriented x3, extra-ocular muscles intact, able to move all 4 extremities, sensation grossly intact. Skin: Warm and dry, no rashes noted.  Respiratory: Not using accessory muscles, speaking in full sentences, trachea midline.  Cardiovascular: Pulses palpable, no extremity edema. Abdomen: Does not appear distended. MSK:  Left ankle mild swollen anterior lateral.  Non-tender Normal ROM  Normal Strength Stable ligament exam.  Normal gait.    CLINICAL DATA:  Left lateral malleolus ankle pain with slight swelling without injury. EXAM: LEFT ANKLE COMPLETE - 3+ VIEW COMPARISON:  None. FINDINGS: No evidence for fracture. No subluxation or dislocation. Degenerative changes are seen at the tibiotalar joint with a prominent os trigonum. Prominent plantar spur arises from the calcaneal tuberosity. IMPRESSION: Degenerative changes without acute bony findings. Electronically Signed   By: Misty Stanley M.D.   On: 02/27/2016 08:43  I personally (independently) visualized and performed  the interpretation of the images attached in this note.   Lab Results  Component Value Date   HGBA1C 8.7 10/07/2017     Assessment and Plan: 72 y.o. male with Left ankle pain. Unclear etiology improving now.  As Billy Armstrong is not very symptomatic I think we can proceed with watchful waiting. If not better will proceed with further workup including repeat xray and possibly MRI to evaluate a loose body.  Recheck PRN.   Diabetes: Improved but not at goal. If not better will proceed with Trulicity. Continue increased exercise and careful diet.     Discussed warning signs or symptoms. Please see discharge instructions. Patient expresses understanding.

## 2017-10-18 ENCOUNTER — Telehealth: Payer: Self-pay | Admitting: Family Medicine

## 2017-10-18 NOTE — Telephone Encounter (Signed)
-----   Message from Gregor Hams, MD sent at 10/17/2017  6:04 PM EDT ----- Regarding: Health Maint Please send health mainitance request forms to [facility] in [place]  And to [facility] for colonoscopy.   Thanks, Ellard Artis

## 2017-10-18 NOTE — Telephone Encounter (Signed)
Records requested

## 2017-10-31 ENCOUNTER — Encounter: Payer: Self-pay | Admitting: Family Medicine

## 2018-01-06 ENCOUNTER — Encounter: Payer: Self-pay | Admitting: Family Medicine

## 2018-01-06 ENCOUNTER — Ambulatory Visit: Payer: BLUE CROSS/BLUE SHIELD | Admitting: Family Medicine

## 2018-01-06 ENCOUNTER — Telehealth: Payer: Self-pay

## 2018-01-06 VITALS — BP 114/67 | HR 64 | Ht 72.0 in | Wt 210.0 lb

## 2018-01-06 DIAGNOSIS — E1165 Type 2 diabetes mellitus with hyperglycemia: Secondary | ICD-10-CM | POA: Diagnosis not present

## 2018-01-06 DIAGNOSIS — Z6828 Body mass index (BMI) 28.0-28.9, adult: Secondary | ICD-10-CM | POA: Diagnosis not present

## 2018-01-06 DIAGNOSIS — E782 Mixed hyperlipidemia: Secondary | ICD-10-CM | POA: Diagnosis not present

## 2018-01-06 DIAGNOSIS — E119 Type 2 diabetes mellitus without complications: Secondary | ICD-10-CM

## 2018-01-06 LAB — CBC
HCT: 43.7 % (ref 38.5–50.0)
Hemoglobin: 15.2 g/dL (ref 13.2–17.1)
MCH: 31.9 pg (ref 27.0–33.0)
MCHC: 34.8 g/dL (ref 32.0–36.0)
MCV: 91.8 fL (ref 80.0–100.0)
MPV: 11.6 fL (ref 7.5–12.5)
Platelets: 205 10*3/uL (ref 140–400)
RBC: 4.76 10*6/uL (ref 4.20–5.80)
RDW: 12.4 % (ref 11.0–15.0)
WBC: 6.4 10*3/uL (ref 3.8–10.8)

## 2018-01-06 LAB — COMPLETE METABOLIC PANEL WITH GFR
AG Ratio: 1.8 (calc) (ref 1.0–2.5)
ALKALINE PHOSPHATASE (APISO): 86 U/L (ref 40–115)
ALT: 19 U/L (ref 9–46)
AST: 21 U/L (ref 10–35)
Albumin: 4.4 g/dL (ref 3.6–5.1)
BILIRUBIN TOTAL: 0.6 mg/dL (ref 0.2–1.2)
BUN: 23 mg/dL (ref 7–25)
CHLORIDE: 105 mmol/L (ref 98–110)
CO2: 28 mmol/L (ref 20–32)
CREATININE: 0.75 mg/dL (ref 0.70–1.18)
Calcium: 9.8 mg/dL (ref 8.6–10.3)
GFR, Est African American: 108 mL/min/{1.73_m2} (ref >=60)
GFR, Est Non African American: 93 mL/min/{1.73_m2} (ref >=60)
GLUCOSE: 150 mg/dL — AB (ref 65–99)
Globulin: 2.5 g/dL (calc) (ref 1.9–3.7)
Potassium: 4.3 mmol/L (ref 3.5–5.3)
SODIUM: 141 mmol/L (ref 135–146)
Total Protein: 6.9 g/dL (ref 6.1–8.1)

## 2018-01-06 LAB — POCT GLYCOSYLATED HEMOGLOBIN (HGB A1C): Hemoglobin A1C: 9.3 % — AB (ref 4.0–5.6)

## 2018-01-06 LAB — POCT UA - MICROALBUMIN
CREATININE, POC: 100 mg/dL
MICROALBUMIN (UR) POC: 10 mg/L

## 2018-01-06 MED ORDER — AMBULATORY NON FORMULARY MEDICATION: 99 refills | Status: DC

## 2018-01-06 MED ORDER — DULAGLUTIDE 0.75 MG/0.5ML ~~LOC~~ SOAJ: 0.5000 mL | SUBCUTANEOUS | 11 refills | Status: DC

## 2018-01-06 NOTE — Progress Notes (Signed)
Billy Armstrong is a 73 y.o. male who presents to [facility]: [facility] today for diabetes follow up.   Patient states he has been working hard to make lifestyle modifications to improve his diabetes. He has been watching his sugar and carb intake as well as exercising more. He is not reporting any new symptoms such as polydipsia and polyuria.   HLD: Doing well with lipitor. No muscle aches and pain.   Overweight. BMI 28. Improved with lifestyle change. He feels well and notes a 10 pound weight loss in 3 months.    ROS as above:  Exam:  BP 114/67   Pulse 64   Ht 6' (1.829 m)   Wt 210 lb (95.3 kg)   BMI 28.48 kg/m  Gen: Well NAD HEENT: EOMI,  MMM Lungs: Normal work of breathing. CTABL Heart: RRR no MRG Abd: NABS, Soft. Nondistended, Nontender Exts: Brisk capillary refill, warm and well perfused.   Lab and Radiology Results Results for orders placed or performed in visit on 01/06/18 (from the past 72 hour(s))  POCT HgB A1C     Status: Abnormal   Collection Time: 01/06/18  8:09 AM  Result Value Ref Range   Hemoglobin A1C 9.3 (A) 4.0 - 5.6 %   HbA1c, POC (prediabetic range)  5.7 - 6.4 %   HbA1c, POC (controlled diabetic range)  0.0 - 7.0 %  POCT UA - Microalbumin     Status: Normal   Collection Time: 01/06/18  8:09 AM  Result Value Ref Range   Microalbumin Ur, POC 10 mg/L   Creatinine, POC 100 mg/dL   Albumin/Creatinine Ratio, Urine, POC <30    No results found.   Assessment and Plan: 71 y.o. male with diabetes.   Patients A1C is still elevated at 9.1. The patient states that his fasting blood sugar has been good and he has been doing the proper lifestyle changes. I am concerned that he is having large blood sugar spikes after meals, which he has not been monitoring. He should continue monitoring his fasting blood sugar and start monitoring his post-prandial blood  sugar. I am also going to add Trulicity to his medications. This should help with post-prandial spikes as well. He should follow up in 1 month to recheck A1C and discuss his recorded blood sugar log.   HLD: Doing well continue liptior.   BMI 28: Improving.    Orders Placed This Encounter  Procedures  . CBC  . COMPLETE METABOLIC PANEL WITH GFR  . POCT HgB A1C  . POCT UA - Microalbumin   Meds ordered this encounter  Medications  . AMBULATORY NON FORMULARY MEDICATION    Sig: Lancets qs 3x daily testing for 90 days. Uncontrolled DM E11.65 Disp 90 day supply    Dispense:  1 each    Refill:  99  . Dulaglutide (TRULICITY) 7.56 EP/3.2RJ SOPN    Sig: Inject 0.5 mLs into the skin once a week.    Dispense:  4 pen    Refill:  11    Patient will have discount coupon     Historical information moved to improve visibility of documentation.  Past Medical History:  Diagnosis Date  . Diabetes mellitus without complication ([place])   . Hyperlipidemia    Past Surgical History:  Procedure Laterality Date  . EYE SURGERY    . KNEE SURGERY    . LEG SURGERY     Social History   Tobacco Use  .  Smoking status: Never Smoker  . Smokeless tobacco: Never Used  Substance Use Topics  . Alcohol use: Yes    Alcohol/week: 0.0 oz   family history includes Cancer in his brother, father, and sister; Diabetes in his mother.  Medications: Current Outpatient Medications  Medication Sig Dispense Refill  . AMBULATORY NON FORMULARY MEDICATION Accu-Check  Guide test strips for 3x daily testing.  Include also lancets for meter.  Disp qs x 3 months.  E11.65 Uncontrolled DM 100 each 12  . aspirin 81 MG tablet Take 81 mg by mouth daily.    Marland Kitchen atorvastatin (LIPITOR) 40 MG tablet Take 1 tablet (40 mg total) by mouth daily. 90 tablet 3  . azelastine (ASTELIN) 0.1 % nasal spray Place 2 sprays into both nostrils 2 (two) times daily. Use in each nostril as directed 30 mL 12  . Calcium Citrate-Vitamin D (CALCIUM + D  PO) Take by mouth.    . Cholecalciferol (D3 ADULT PO) Take by mouth.    . Dapagliflozin-Metformin HCl ER (XIGDUO XR) 05-999 MG TB24 Take 1 tablet by mouth daily. 90 tablet 3  . diclofenac sodium (VOLTAREN) 1 % GEL Apply 2 g topically 4 (four) times daily. To affected joint. 100 g 11  . fluticasone (FLONASE) 50 MCG/ACT nasal spray Place 2 sprays into both nostrils daily. 16 g 2  . hydrocortisone (ANUSOL-HC) 2.5 % rectal cream Place 1 application rectally 2 (two) times daily. 30 g 2  . ibuprofen (ADVIL,MOTRIN) 200 MG tablet Take 200 mg by mouth every 6 (six) hours as needed.    . Multiple Vitamins-Minerals (MULTIVITAMIN PO) Take by mouth.    . Omega-3 Fatty Acids (FISH OIL PO) Take by mouth.    . AMBULATORY NON FORMULARY MEDICATION Lancets qs 3x daily testing for 90 days. Uncontrolled DM E11.65 Disp 90 day supply 1 each 99  . Dulaglutide (TRULICITY) 8.78 MV/6.7MC SOPN Inject 0.5 mLs into the skin once a week. 4 pen 11   No current facility-administered medications for this visit.    No Known Allergies  Health Maintenance Health Maintenance  Topic Date Due  . OPHTHALMOLOGY EXAM  06/19/2017  . URINE MICROALBUMIN  12/03/2017  . INFLUENZA VACCINE  04/06/2029 (Originally 03/06/2018)  . FOOT EXAM  03/11/2018  . HEMOGLOBIN A1C  04/09/2018  . COLONOSCOPY  08/24/2019  . TETANUS/TDAP  02/26/2026  . Hepatitis C Screening  Completed  . PNA vac Low Risk Adult  Completed    Discussed warning signs or symptoms. Please see discharge instructions. Patient expresses understanding.

## 2018-01-06 NOTE — Telephone Encounter (Signed)
Patient called and wanted clarification if he should be taking Trulicity or wait and take it after 30 days. Please advise. Rhonda Cunningham,CMA

## 2018-01-06 NOTE — Patient Instructions (Addendum)
Thank you for coming in today. Check blood sugar 3x daily for 1 month.  Start trulicity.  Recheck with me in 1 month.  Get labs today.   Dulaglutide injection What is this medicine? DULAGLUTIDE (DOO la GLOO tide) is used to improve blood sugar control in adults with type 2 diabetes. This medicine may be used with other oral diabetes medicines. This medicine may be used for other purposes; ask your health care provider or pharmacist if you have questions. COMMON BRAND NAME(S): TRULICITY What should I tell my health care provider before I take this medicine? They need to know if you have any of these conditions: -endocrine tumors (MEN 2) or if someone in your family had these tumors -history of pancreatitis -kidney disease -liver disease -stomach problems -thyroid cancer or if someone in your family had thyroid cancer -an unusual or allergic reaction to dulaglutide, other medicines, foods, dyes, or preservatives -pregnant or trying to get pregnant -breast-feeding How should I use this medicine? This medicine is for injection under the skin of your upper leg (thigh), stomach area, or upper arm. It is usually given once every week (every 7 days). You will be taught how to prepare and give this medicine. Use exactly as directed. Take your medicine at regular intervals. Do not take it more often than directed. If you use this medicine with insulin, you should inject this medicine and the insulin separately. Do not mix them together. Do not give the injections right next to each other. Change (rotate) injection sites with each injection. It is important that you put your used needles and syringes in a special sharps container. Do not put them in a trash can. If you do not have a sharps container, call your pharmacist or healthcare provider to get one. A special MedGuide will be given to you by the pharmacist with each prescription and refill. Be sure to read this information carefully each  time. Talk to your pediatrician regarding the use of this medicine in children. Special care may be needed. Overdosage: If you think you have taken too much of this medicine contact a poison control center or emergency room at once. NOTE: This medicine is only for you. Do not share this medicine with others. What if I miss a dose? If you miss a dose, take it as soon as you can within 3 days after the missed dose. Then take your next dose at your regular weekly time. If it has been longer than 3 days after the missed dose, do not take the missed dose. Take the next dose at your regular time. Do not take double or extra doses. If you have questions about a missed dose, contact your health care provider for advice. What may interact with this medicine? -other medicines for diabetes Many medications may cause changes in blood sugar, these include: -alcohol containing beverages -antiviral medicines for HIV or AIDS -aspirin and aspirin-like drugs -certain medicines for blood pressure, heart disease, irregular heart beat -chromium -diuretics -male hormones, such as estrogens or progestins, birth control pills -fenofibrate -gemfibrozil -isoniazid -lanreotide -male hormones or anabolic steroids -MAOIs like Carbex, Eldepryl, Marplan, Nardil, and Parnate -medicines for weight loss -medicines for allergies, asthma, cold, or cough -medicines for depression, anxiety, or psychotic disturbances -niacin -nicotine -NSAIDs, medicines for pain and inflammation, like ibuprofen or naproxen -octreotide -pasireotide -pentamidine -phenytoin -probenecid -quinolone antibiotics such as ciprofloxacin, levofloxacin, ofloxacin -some herbal dietary supplements -steroid medicines such as prednisone or cortisone -sulfamethoxazole; trimethoprim -thyroid hormones Some medications can  hide the warning symptoms of low blood sugar (hypoglycemia). You may need to monitor your blood sugar more closely if you are  taking one of these medications. These include: -beta-blockers, often used for high blood pressure or heart problems (examples include atenolol, metoprolol, propranolol) -clonidine -guanethidine -reserpine This list may not describe all possible interactions. Give your health care provider a list of all the medicines, herbs, non-prescription drugs, or dietary supplements you use. Also tell them if you smoke, drink alcohol, or use illegal drugs. Some items may interact with your medicine. What should I watch for while using this medicine? Visit your doctor or health care professional for regular checks on your progress. Drink plenty of fluids while taking this medicine. Check with your doctor or health care professional if you get an attack of severe diarrhea, nausea, and vomiting. The loss of too much body fluid can make it dangerous for you to take this medicine. A test called the HbA1C (A1C) will be monitored. This is a simple blood test. It measures your blood sugar control over the last 2 to 3 months. You will receive this test every 3 to 6 months. Learn how to check your blood sugar. Learn the symptoms of low and high blood sugar and how to manage them. Always carry a quick-source of sugar with you in case you have symptoms of low blood sugar. Examples include hard sugar candy or glucose tablets. Make sure others know that you can choke if you eat or drink when you develop serious symptoms of low blood sugar, such as seizures or unconsciousness. They must get medical help at once. Tell your doctor or health care professional if you have high blood sugar. You might need to change the dose of your medicine. If you are sick or exercising more than usual, you might need to change the dose of your medicine. Do not skip meals. Ask your doctor or health care professional if you should avoid alcohol. Many nonprescription cough and cold products contain sugar or alcohol. These can affect blood sugar. Pens  should never be shared. Even if the needle is changed, sharing may result in passing of viruses like hepatitis or HIV. Wear a medical ID bracelet or chain, and carry a card that describes your disease and details of your medicine and dosage times. What side effects may I notice from receiving this medicine? Side effects that you should report to your doctor or health care professional as soon as possible: -allergic reactions like skin rash, itching or hives, swelling of the face, lips, or tongue -breathing problems -diarrhea that continues or is severe -lump or swelling on the neck -severe nausea -signs and symptoms of infection like fever or chills; cough; sore throat; pain or trouble passing urine -signs and symptoms of low blood sugar such as feeling anxious, confusion, dizziness, increased hunger, unusually weak or tired, sweating, shakiness, cold, irritable, headache, blurred vision, fast heartbeat, loss of consciousness -signs and symptoms of kidney injury like trouble passing urine or change in the amount of urine -trouble swallowing -unusual stomach upset or pain -vomiting Side effects that usually do not require medical attention (report to your doctor or health care professional if they continue or are bothersome): -diarrhea -loss of appetite -nausea -pain, redness, or irritation at site where injected -stomach upset This list may not describe all possible side effects. Call your doctor for medical advice about side effects. You may report side effects to FDA at 1-800-FDA-1088. Where should I keep my medicine? Keep  out of the reach of children. Store unopened pens in a refrigerator between 2 and 8 degrees C (36 and 46 degrees F). Do not freeze or use if the medicine has been frozen. Protect from light and excessive heat. Store in the carton until use. Each single-dose pen can be kept at room temperature, not to exceed 30 degrees C (86 degrees F) for a total of 14 days, if needed.  Throw away any unused medicine after the expiration date on the label. NOTE: This sheet is a summary. It may not cover all possible information. If you have questions about this medicine, talk to your doctor, pharmacist, or health care provider.  2018 Elsevier/Gold Standard (2016-08-09 14:35:01)

## 2018-01-07 NOTE — Telephone Encounter (Signed)
Start taking it now

## 2018-01-07 NOTE — Telephone Encounter (Signed)
Patient has been informed. Rhonda Cunningham,CMA  

## 2018-02-03 ENCOUNTER — Encounter: Payer: Self-pay | Admitting: Family Medicine

## 2018-02-03 ENCOUNTER — Ambulatory Visit (INDEPENDENT_AMBULATORY_CARE_PROVIDER_SITE_OTHER): Payer: BLUE CROSS/BLUE SHIELD | Admitting: Family Medicine

## 2018-02-03 VITALS — BP 104/67 | HR 73 | Ht 72.0 in | Wt 203.0 lb

## 2018-02-03 DIAGNOSIS — E1165 Type 2 diabetes mellitus with hyperglycemia: Secondary | ICD-10-CM

## 2018-02-03 MED ORDER — DULAGLUTIDE 0.75 MG/0.5ML ~~LOC~~ SOAJ: 0.5000 mL | SUBCUTANEOUS | 3 refills | Status: DC

## 2018-02-03 NOTE — Progress Notes (Signed)
Billy Armstrong is a 72 y.o. male who presents to [facility]: [facility] today for diabetes follow-up.  Billy Armstrong was seen a month ago for diabetes.  At that time his A1c was 9 and his blood sugar was not controlled likely due to elevated mealtime blood sugars.  He was started on Trulicity in addition to his chronic medications.  He tolerates it well and notes his blood sugars have been significantly improved.  He notes his average blood sugar including fasting mealtimes is about 130.  He denies polyuria polydipsia.   ROS as above:  Exam:  BP 104/67   Pulse 73   Ht 6' (1.829 m)   Wt 203 lb (92.1 kg)   BMI 27.53 kg/m  Gen: Well NAD HEENT: EOMI,  MMM Lungs: Normal work of breathing. CTABL Heart: RRR no MRG Abd: NABS, Soft. Nondistended, Nontender Exts: Brisk capillary refill, warm and well perfused.     Assessment and Plan: 72 y.o. male with  Diabetes: Significant improvement.  Plan to continue Trulicity as well as other medications.  Continue low carbohydrate diet and exercise.  Recheck in about 2-1/2 months.  I spent 15 minutes with this patient, greater than 50% was face-to-face time counseling regarding carbs, medicine and exercise.    No orders of the defined types were placed in this encounter.  Meds ordered this encounter  Medications  . Dulaglutide (TRULICITY) 0.10 XN/2.3FT SOPN    Sig: Inject 0.5 mLs into the skin once a week.    Dispense:  12 pen    Refill:  3    90 day supply     Historical information moved to improve visibility of documentation.  Past Medical History:  Diagnosis Date  . Diabetes mellitus without complication ([place])   . Hyperlipidemia    Past Surgical History:  Procedure Laterality Date  . EYE SURGERY    . KNEE SURGERY    . LEG SURGERY     Social History   Tobacco Use  . Smoking status: Never Smoker  . Smokeless tobacco: Never Used   Substance Use Topics  . Alcohol use: Yes    Alcohol/week: 0.0 oz   family history includes Cancer in his brother, father, and sister; Diabetes in his mother.  Medications: Current Outpatient Medications  Medication Sig Dispense Refill  . AMBULATORY NON FORMULARY MEDICATION Accu-Check  Guide test strips for 3x daily testing.  Include also lancets for meter.  Disp qs x 3 months.  E11.65 Uncontrolled DM 100 each 12  . AMBULATORY NON FORMULARY MEDICATION Lancets qs 3x daily testing for 90 days. Uncontrolled DM E11.65 Disp 90 day supply 1 each 99  . aspirin 81 MG tablet Take 81 mg by mouth daily.    Marland Kitchen atorvastatin (LIPITOR) 40 MG tablet Take 1 tablet (40 mg total) by mouth daily. 90 tablet 3  . azelastine (ASTELIN) 0.1 % nasal spray Place 2 sprays into both nostrils 2 (two) times daily. Use in each nostril as directed 30 mL 12  . Calcium Citrate-Vitamin D (CALCIUM + D PO) Take by mouth.    . Cholecalciferol (D3 ADULT PO) Take by mouth.    . Dapagliflozin-Metformin HCl ER (XIGDUO XR) 05-999 MG TB24 Take 1 tablet by mouth daily. 90 tablet 3  . diclofenac sodium (VOLTAREN) 1 % GEL Apply 2 g topically 4 (four) times daily. To affected joint. 100 g 11  . Dulaglutide (TRULICITY) 7.32 KG/2.5KY SOPN Inject 0.5 mLs into the skin  once a week. 12 pen 3  . fluticasone (FLONASE) 50 MCG/ACT nasal spray Place 2 sprays into both nostrils daily. 16 g 2  . hydrocortisone (ANUSOL-HC) 2.5 % rectal cream Place 1 application rectally 2 (two) times daily. 30 g 2  . ibuprofen (ADVIL,MOTRIN) 200 MG tablet Take 200 mg by mouth every 6 (six) hours as needed.    . Multiple Vitamins-Minerals (MULTIVITAMIN PO) Take by mouth.    . Omega-3 Fatty Acids (FISH OIL PO) Take by mouth.     No current facility-administered medications for this visit.    No Known Allergies   Discussed warning signs or symptoms. Please see discharge instructions. Patient expresses understanding.

## 2018-02-03 NOTE — Patient Instructions (Signed)
Thank you for coming in today. Check back Mid September.  Return sooner if needed.

## 2018-02-05 ENCOUNTER — Ambulatory Visit: Payer: BLUE CROSS/BLUE SHIELD | Admitting: Family Medicine

## 2018-02-27 ENCOUNTER — Encounter: Payer: Self-pay | Admitting: Family Medicine

## 2018-03-21 ENCOUNTER — Other Ambulatory Visit: Payer: Self-pay

## 2018-03-21 MED ORDER — DAPAGLIFLOZIN PRO-METFORMIN ER 10-1000 MG PO TB24: 1.0000 | ORAL_TABLET | Freq: Every day | ORAL | 3 refills | Status: DC

## 2018-04-21 ENCOUNTER — Encounter: Payer: Self-pay | Admitting: Family Medicine

## 2018-04-21 ENCOUNTER — Ambulatory Visit: Payer: BLUE CROSS/BLUE SHIELD | Admitting: Family Medicine

## 2018-04-21 VITALS — BP 106/72 | HR 71 | Ht 72.0 in | Wt 206.0 lb

## 2018-04-21 DIAGNOSIS — M79601 Pain in right arm: Secondary | ICD-10-CM | POA: Diagnosis not present

## 2018-04-21 DIAGNOSIS — B353 Tinea pedis: Secondary | ICD-10-CM

## 2018-04-21 DIAGNOSIS — E1169 Type 2 diabetes mellitus with other specified complication: Secondary | ICD-10-CM | POA: Insufficient documentation

## 2018-04-21 DIAGNOSIS — E1165 Type 2 diabetes mellitus with hyperglycemia: Secondary | ICD-10-CM

## 2018-04-21 DIAGNOSIS — E785 Hyperlipidemia, unspecified: Secondary | ICD-10-CM

## 2018-04-21 LAB — POCT GLYCOSYLATED HEMOGLOBIN (HGB A1C): Hemoglobin A1C: 6.5 % — AB (ref 4.0–5.6)

## 2018-04-21 MED ORDER — AMBULATORY NON FORMULARY MEDICATION: 99 refills | Status: DC

## 2018-04-21 MED ORDER — ATORVASTATIN CALCIUM 40 MG PO TABS: 40.0000 mg | ORAL_TABLET | Freq: Every day | ORAL | 3 refills | Status: DC

## 2018-04-21 NOTE — Patient Instructions (Addendum)
Thank you for coming in today. Continue current medicine.  Recheck in 3 months.  Get fasting labs prior to next visit.  Send me a reminder about 1 week before.    Athlete's Foot Athlete's foot (tinea pedis) is a fungal infection of the skin on the feet. It often occurs on the skin that is between or underneath the toes. It can also occur on the soles of the feet. The infection can spread from person to person (is contagious). What are the causes? Athlete's foot is caused by a fungus. This fungus grows in warm, moist places. Most people get athlete's foot by sharing shower stalls, towels, and wet floors with someone who is infected. Not washing your feet or changing your socks often enough can contribute to athlete's foot. What increases the risk? This condition is more likely to develop in:  Men.  People who have a weak body defense system (immune system).  People who have diabetes.  People who use public showers, such as at a gym.  People who wear heavy-duty shoes, such as Environmental manager.  Seasons with warm, humid weather.  What are the signs or symptoms? Symptoms of this condition include:  Itchy areas between the toes or on the soles of the feet.  White, flaky, or scaly areas between the toes or on the soles of the feet.  Very itchy small blisters between the toes or on the soles of the feet.  Small cuts on the skin. These cuts can become infected.  Thick or discolored toenails.  How is this diagnosed? This condition is diagnosed with a medical history and physical exam. Your health care provider may also take a skin or toenail sample to be examined. How is this treated? Treatment for this condition includes antifungal medicines. These may be applied as powders, ointments, or creams. In severe cases, an oral antifungal medicine may be given. Follow these instructions at home:  Apply or take over-the-counter and prescription medicines only as told by your  health care provider.  Keep all follow-up visits as told by your health care provider. This is important.  Do not scratch your feet.  Keep your feet dry: ? Wear cotton or wool socks. Change your socks every day or if they become wet. ? Wear shoes that allow air to circulate, such as sandals or canvas tennis shoes.  Wash and dry your feet: ? Every day or as told by your health care provider. ? After exercising. ? Including the area between your toes.  Do not share towels, nail clippers, or other personal items that touch your feet with others.  If you have diabetes, keep your blood sugar under control. How is this prevented?  Do not share towels.  Wear sandals in wet areas, such as locker rooms and shared showers.  Keep your feet dry: ? Wear cotton or wool socks. Change your socks every day or if they become wet. ? Wear shoes that allow air to circulate, such as sandals or canvas tennis shoes.  Wash and dry your feet after exercising. Pay attention to the area between your toes. Contact a health care provider if:  You have a fever.  You have swelling, soreness, warmth, or redness in your foot.  You are not getting better with treatment.  Your symptoms get worse.  You have new symptoms. This information is not intended to replace advice given to you by your health care provider. Make sure you discuss any questions you have with your  health care provider. Document Released: 07/20/2000 Document Revised: 12/29/2015 Document Reviewed: 01/24/2015 Elsevier Interactive Patient Education  2018 [employer].

## 2018-04-21 NOTE — Progress Notes (Signed)
Billy Armstrong is a 72 y.o. male who presents to [facility]: [facility] today for DM, biceps pain.   DM: his diabetes is well controled. His fasting sugars have been in the low to mid 100s and he has been watching his diet. He gets a lot of exercise at work and he also does a lot of yard work. His medications have been going well and says he has not had any side effects.   Biceps pain: he has had pain in his right biceps for 2 weeks. He did not have an acute trauma and says the pain gradually came on. The pain is worse with use and better with rest. It has not limited his functionality and he takes ibuprofen which provides some relief.    ROS as above:  Exam:  BP 106/72   Pulse 71   Ht 6' (1.829 m)   Wt 206 lb (93.4 kg)   BMI 27.94 kg/m  Wt Readings from Last 5 Encounters:  04/21/18 206 lb (93.4 kg)  02/03/18 203 lb (92.1 kg)  01/06/18 210 lb (95.3 kg)  10/17/17 222 lb (100.7 kg)  10/07/17 216 lb (98 kg)    Gen: Well NAD HEENT: EOMI,  MMM Lungs: Normal work of breathing. CTABL Heart: RRR no MRG Abd: NABS, Soft. Nondistended, Nontender Exts: Brisk capillary refill, warm and well perfused.  Right arm: normal appearing with no obvious deformities  Non tender to palpation in biceps muscle belly or bicipital groove. Mild pain with flexion against resistance  Normal ROM, normal strength Negative Yergason's and speed's tests  Pulses intact distally   Diabetic Foot Exam - Simple   Simple Foot Form Diabetic Foot exam was performed with the following findings:  Yes 04/21/2018 10:25 AM  Visual Inspection See comments:  Yes Sensation Testing Intact to touch and monofilament testing bilaterally:  Yes Pulse Check Posterior Tibialis and Dorsalis pulse intact bilaterally:  Yes Comments Tinea pedis bilateral fourth and fifth toe interdigital space      Lab and Radiology  Results Results for orders placed or performed in visit on 04/21/18 (from the past 72 hour(s))  POCT HgB A1C     Status: Abnormal   Collection Time: 04/21/18  8:19 AM  Result Value Ref Range   Hemoglobin A1C 6.5 (A) 4.0 - 5.6 %   HbA1c POC (<> result, manual entry)     HbA1c, POC (prediabetic range)     HbA1c, POC (controlled diabetic range)     No results found.    Assessment and Plan: 72 y.o. male with DM and biceps pain.   DM: his diabetes is extremely well controled today. HbA1c is 6.5 down from 9.3 in June. The plan will be for him to continue his current medication regimen and to keep watching his diet and exercising. Diabetic foot exam was normal today. Return to clinic in 3 months.   Biceps pain: his pain in his right biceps is likely a minor strain secondary to activity. He has a negative Yergasons and speeds tests making proximal biceps tendonitis less likely. He should continue to take ibuprofen as needed and return to clinic if he is not improving.   Tinea pedis: Recommend over-the-counter topical terbinafine and foot hygiene.   Orders Placed This Encounter  Procedures  . POCT HgB A1C   Meds ordered this encounter  Medications  . AMBULATORY NON FORMULARY MEDICATION    Sig: Lancets qs 1 daily testing for 90  days.  DM E11.69 Disp 90 day supply    Dispense:  1 each    Refill:  99  . atorvastatin (LIPITOR) 40 MG tablet    Sig: Take 1 tablet (40 mg total) by mouth daily.    Dispense:  90 tablet    Refill:  3     Historical information moved to improve visibility of documentation.  Past Medical History:  Diagnosis Date  . Diabetes mellitus without complication ([place])   . Hyperlipidemia    Past Surgical History:  Procedure Laterality Date  . EYE SURGERY    . KNEE SURGERY    . LEG SURGERY     Social History   Tobacco Use  . Smoking status: Never Smoker  . Smokeless tobacco: Never Used  Substance Use Topics  . Alcohol use: Yes    Alcohol/week: 0.0  standard drinks   family history includes Cancer in his brother, father, and sister; Diabetes in his mother.  Medications: Current Outpatient Medications  Medication Sig Dispense Refill  . AMBULATORY NON FORMULARY MEDICATION Accu-Check  Guide test strips for 3x daily testing.  Include also lancets for meter.  Disp qs x 3 months.  E11.65 Uncontrolled DM 100 each 12  . AMBULATORY NON FORMULARY MEDICATION Lancets qs 1 daily testing for 90 days.  DM E11.69 Disp 90 day supply 1 each 99  . aspirin 81 MG tablet Take 81 mg by mouth daily.    Marland Kitchen atorvastatin (LIPITOR) 40 MG tablet Take 1 tablet (40 mg total) by mouth daily. 90 tablet 3  . azelastine (ASTELIN) 0.1 % nasal spray Place 2 sprays into both nostrils 2 (two) times daily. Use in each nostril as directed 30 mL 12  . Calcium Citrate-Vitamin D (CALCIUM + D PO) Take by mouth.    . Cholecalciferol (D3 ADULT PO) Take by mouth.    . Dapagliflozin-metFORMIN HCl ER (XIGDUO XR) 05-999 MG TB24 Take 1 tablet by mouth daily. 90 tablet 3  . diclofenac sodium (VOLTAREN) 1 % GEL Apply 2 g topically 4 (four) times daily. To affected joint. 100 g 11  . Dulaglutide (TRULICITY) 9.40 HW/8.0SU SOPN Inject 0.5 mLs into the skin once a week. 12 pen 3  . fluticasone (FLONASE) 50 MCG/ACT nasal spray Place 2 sprays into both nostrils daily. 16 g 2  . hydrocortisone (ANUSOL-HC) 2.5 % rectal cream Place 1 application rectally 2 (two) times daily. 30 g 2  . ibuprofen (ADVIL,MOTRIN) 200 MG tablet Take 200 mg by mouth every 6 (six) hours as needed.    . Multiple Vitamins-Minerals (MULTIVITAMIN PO) Take by mouth.    . Omega-3 Fatty Acids (FISH OIL PO) Take by mouth.     No current facility-administered medications for this visit.    No Known Allergies   Discussed warning signs or symptoms. Please see discharge instructions. Patient expresses understanding.  I personally was present and performed or re-performed the history, physical exam and medical decision-making  activities of this service and have verified that the service and findings are accurately documented in the student's note. ___________________________________________ Lynne Leader M.D., ABFM., CAQSM. Primary Care and Sports Medicine Adjunct Instructor of [employer]

## 2018-06-26 LAB — HM DIABETES EYE EXAM

## 2018-07-05 IMAGING — DX DG CHEST 2V
2 series · 2 of 2 positions shown · non-contrast
Comparison: 10/30/2014

CLINICAL DATA: Left clavicular pain

EXAM:
CHEST  2 VIEW

[chest pa]
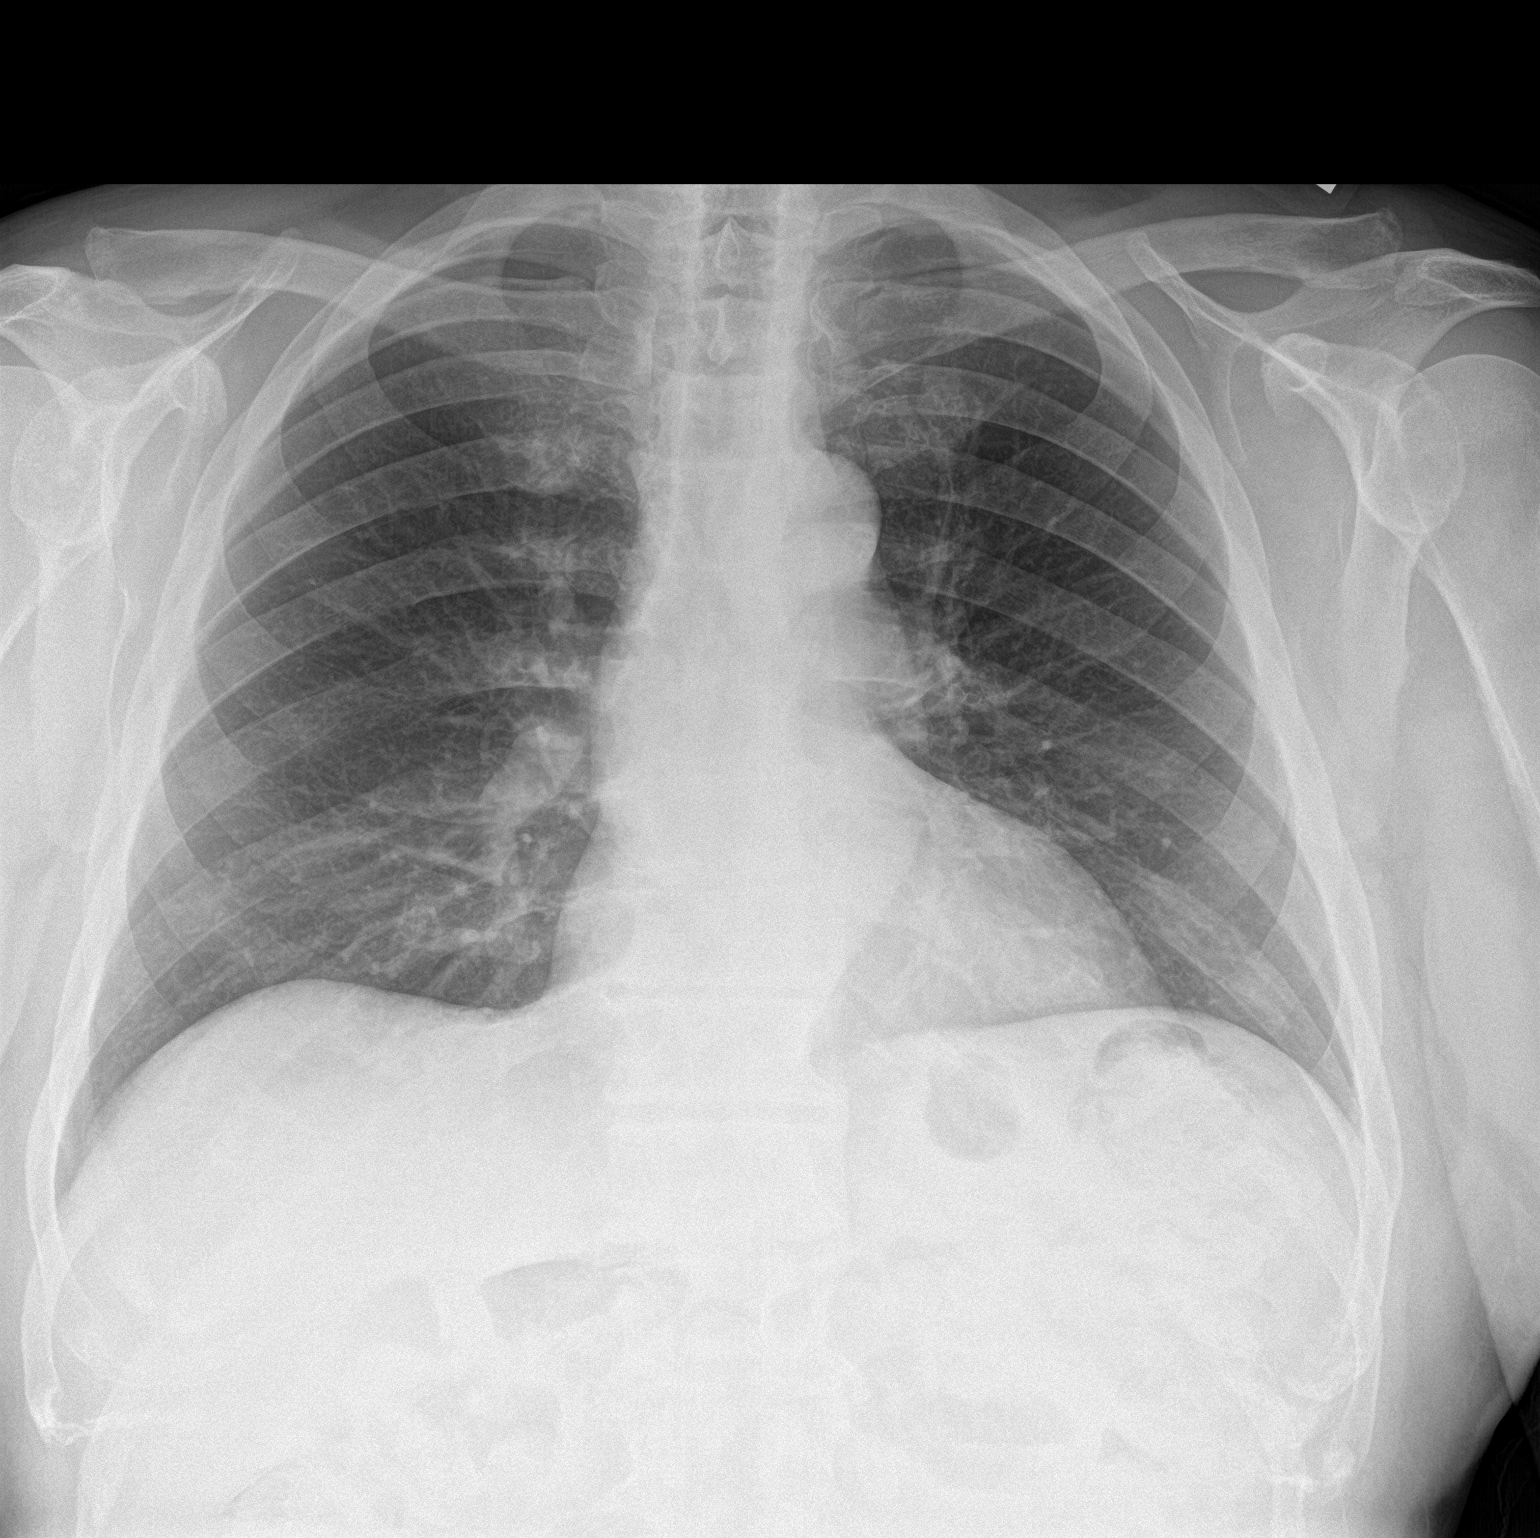

[chest lat]
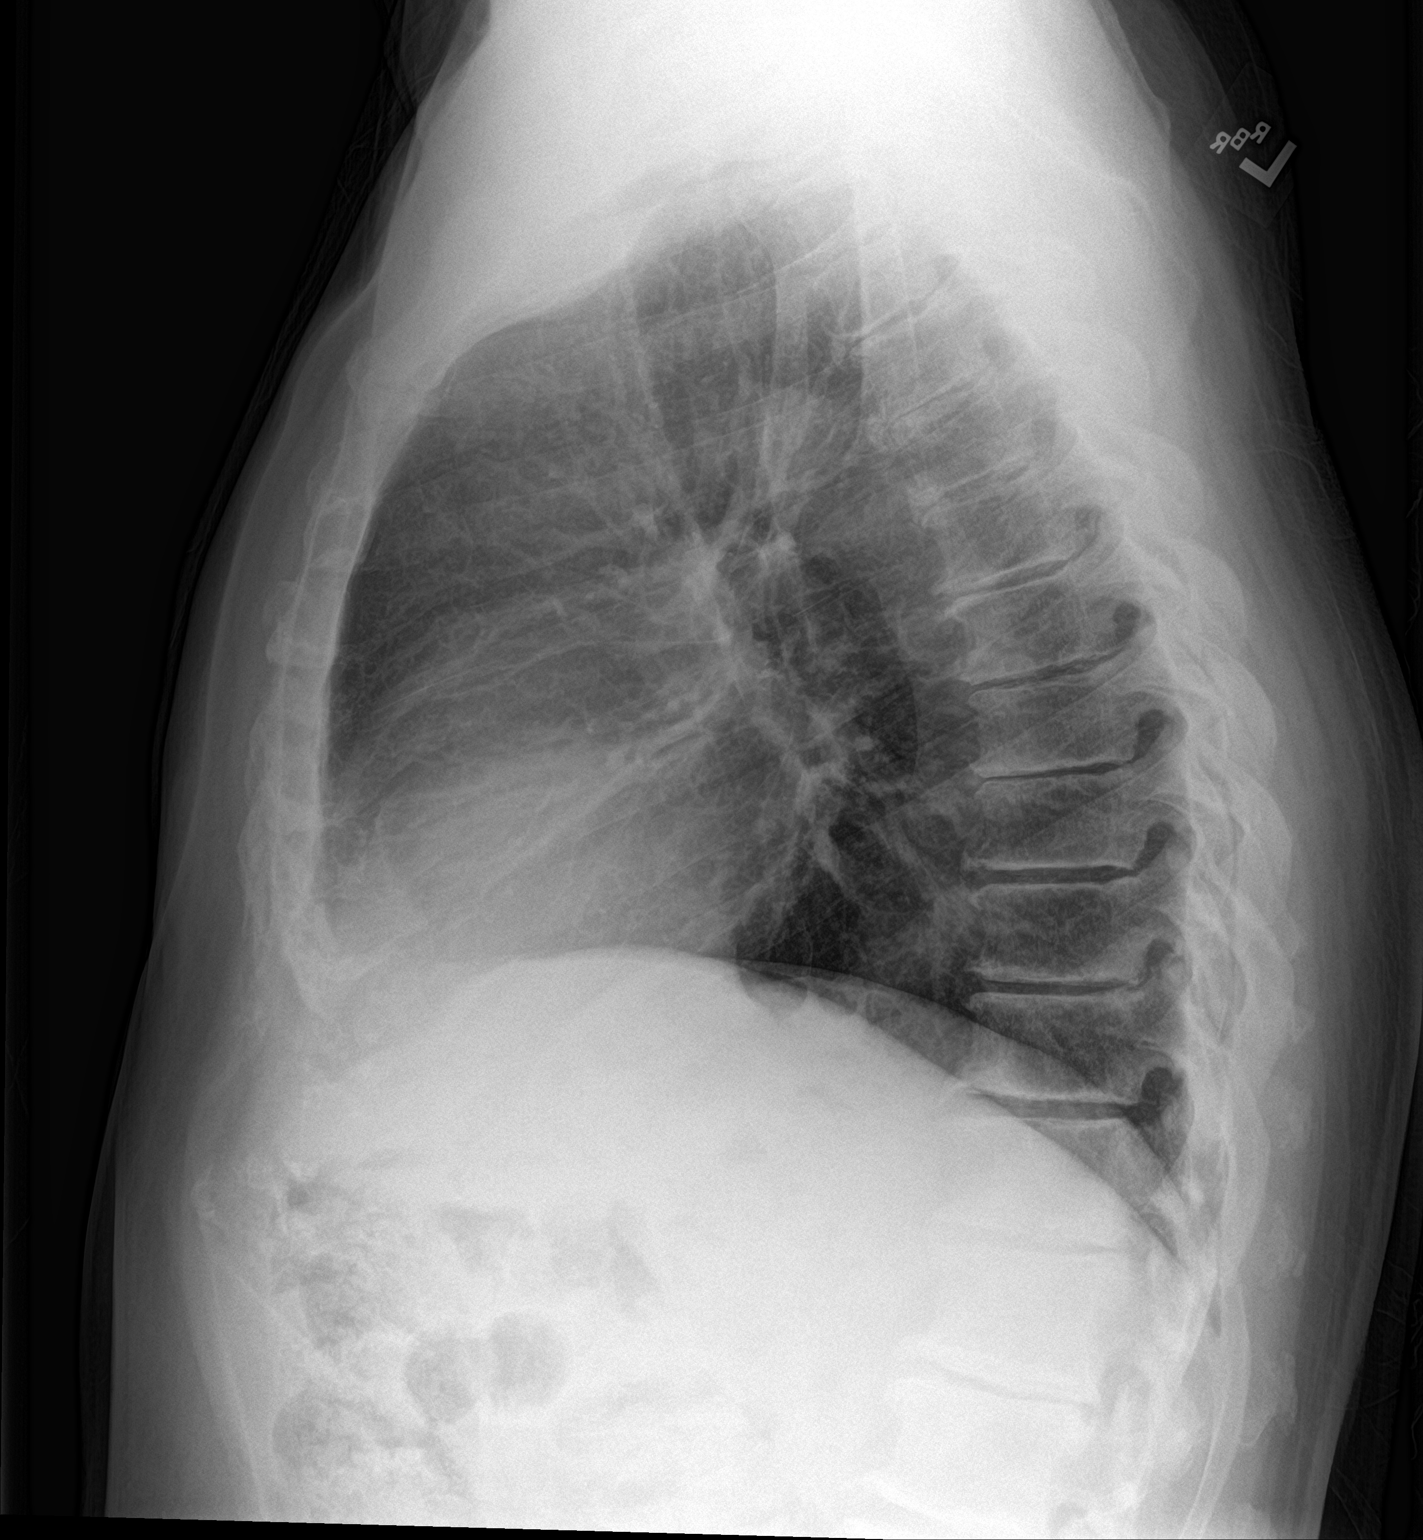

[2 of 2 positions shown; findings below may reference images not displayed]

FINDINGS: The cardiac shadow is within normal limits. The lungs are well
aerated bilaterally. Mild degenerative changes of the
acromioclavicular joints are seen bilaterally. Degenerative changes
of the thoracic spine are noted as well. No acute abnormality noted.
IMPRESSION: No active cardiopulmonary disease.

## 2018-07-17 ENCOUNTER — Other Ambulatory Visit: Payer: Self-pay | Admitting: Family Medicine

## 2018-07-21 ENCOUNTER — Ambulatory Visit: Payer: BLUE CROSS/BLUE SHIELD | Admitting: Family Medicine

## 2018-07-25 ENCOUNTER — Ambulatory Visit (INDEPENDENT_AMBULATORY_CARE_PROVIDER_SITE_OTHER): Payer: BLUE CROSS/BLUE SHIELD | Admitting: Family Medicine

## 2018-07-25 ENCOUNTER — Encounter: Payer: Self-pay | Admitting: Family Medicine

## 2018-07-25 VITALS — BP 117/70 | HR 65 | Ht 72.0 in | Wt 215.0 lb

## 2018-07-25 DIAGNOSIS — R351 Nocturia: Secondary | ICD-10-CM

## 2018-07-25 DIAGNOSIS — E782 Mixed hyperlipidemia: Secondary | ICD-10-CM

## 2018-07-25 DIAGNOSIS — E1169 Type 2 diabetes mellitus with other specified complication: Secondary | ICD-10-CM | POA: Diagnosis not present

## 2018-07-25 DIAGNOSIS — E1165 Type 2 diabetes mellitus with hyperglycemia: Secondary | ICD-10-CM

## 2018-07-25 DIAGNOSIS — Z125 Encounter for screening for malignant neoplasm of prostate: Secondary | ICD-10-CM

## 2018-07-25 DIAGNOSIS — E785 Hyperlipidemia, unspecified: Secondary | ICD-10-CM

## 2018-07-25 DIAGNOSIS — Z9189 Other specified personal risk factors, not elsewhere classified: Secondary | ICD-10-CM | POA: Diagnosis not present

## 2018-07-25 DIAGNOSIS — M79641 Pain in right hand: Secondary | ICD-10-CM

## 2018-07-25 LAB — LIPID PANEL W/REFLEX DIRECT LDL
Cholesterol: 103 mg/dL (ref <200)
HDL: 50 mg/dL (ref >40)
LDL CHOLESTEROL (CALC): 42 mg/dL
Non-HDL Cholesterol (Calc): 53 mg/dL (calc) (ref <130)
TRIGLYCERIDES: 39 mg/dL (ref <150)
Total CHOL/HDL Ratio: 2.1 (calc) (ref <5.0)

## 2018-07-25 LAB — COMPLETE METABOLIC PANEL WITH GFR
AG RATIO: 1.7 (calc) (ref 1.0–2.5)
ALT: 19 U/L (ref 9–46)
AST: 16 U/L (ref 10–35)
Albumin: 4.3 g/dL (ref 3.6–5.1)
Alkaline phosphatase (APISO): 91 U/L (ref 40–115)
BUN: 21 mg/dL (ref 7–25)
CO2: 29 mmol/L (ref 20–32)
Calcium: 9.7 mg/dL (ref 8.6–10.3)
Chloride: 105 mmol/L (ref 98–110)
Creat: 0.83 mg/dL (ref 0.70–1.18)
GFR, Est African American: 103 mL/min/{1.73_m2} (ref >=60)
GFR, Est Non African American: 89 mL/min/{1.73_m2} (ref >=60)
GLOBULIN: 2.5 g/dL (ref 1.9–3.7)
Glucose, Bld: 145 mg/dL — ABNORMAL HIGH (ref 65–99)
Potassium: 4.6 mmol/L (ref 3.5–5.3)
Sodium: 142 mmol/L (ref 135–146)
Total Bilirubin: 0.5 mg/dL (ref 0.2–1.2)
Total Protein: 6.8 g/dL (ref 6.1–8.1)

## 2018-07-25 LAB — POCT GLYCOSYLATED HEMOGLOBIN (HGB A1C): HEMOGLOBIN A1C: 6.9 % — AB (ref 4.0–5.6)

## 2018-07-25 LAB — CBC
HCT: 43.8 % (ref 38.5–50.0)
Hemoglobin: 14.8 g/dL (ref 13.2–17.1)
MCH: 31.8 pg (ref 27.0–33.0)
MCHC: 33.8 g/dL (ref 32.0–36.0)
MCV: 94.2 fL (ref 80.0–100.0)
MPV: 11.7 fL (ref 7.5–12.5)
Platelets: 199 10*3/uL (ref 140–400)
RBC: 4.65 10*6/uL (ref 4.20–5.80)
RDW: 12.2 % (ref 11.0–15.0)
WBC: 6.7 10*3/uL (ref 3.8–10.8)

## 2018-07-25 LAB — PSA: PSA: 1.1 ng/mL (ref <=4.0)

## 2018-07-25 MED ORDER — AMBULATORY NON FORMULARY MEDICATION: 12 refills | Status: AC

## 2018-07-25 NOTE — Patient Instructions (Signed)
Thank you for coming in today. Continue current medicine.  Keep the carbs low.  Get fasting labs today.  Recheck as needed or in 3 months.  Let me know if I need to change medicines.  Get new meter in the new year.

## 2018-07-25 NOTE — Progress Notes (Signed)
Billy Armstrong is a 72 y.o. male who presents to [facility] today for diabetes, hyperlipidemia, hand pain, prostate cancer screening.  Billy Armstrong has type 2 diabetes and takes medications listed below.  He notes his blood sugars typically well controlled.  He notes his health insurance will require different brand of glucose meter starting in January.  He has plenty of test strips now.  Hyperlipidemia: Billy Armstrong takes atorvastatin listed below.  Additionally he takes aspirin 81 mg.  He tolerates it well with no issues.  Hand pain: Billy Armstrong has a 1 week history of mild pain in the first and second MCPs of his right hand.  He is right-hand dominant.  He notes the pain is sharp intermittent and mild.  It is not bothering him much or interfering with work at this time.  He denies any injury.  Additionally he is due for fasting labs and would like PSA testing for prostate cancer screening.  He does have occasional nocturia to be related to BPH.   ROS as above:  Exam:  BP 117/70   Pulse 65   Ht 6' (1.829 m)   Wt 215 lb (97.5 kg)   BMI 29.16 kg/m  Wt Readings from Last 5 Encounters:  07/25/18 215 lb (97.5 kg)  04/21/18 206 lb (93.4 kg)  02/03/18 203 lb (92.1 kg)  01/06/18 210 lb (95.3 kg)  10/17/17 222 lb (100.7 kg)    Gen: Well NAD HEENT: EOMI,  MMM Lungs: Normal work of breathing. CTABL Heart: RRR no MRG Abd: NABS, Soft. Nondistended, Nontender Exts: Brisk capillary refill, warm and well perfused.  Right hand normal-appearing, mildly tender to palpation first and second MCP.  Normal hand motion.  Intact strength.  Pulses capillary fill and sensation are intact distally.  Lab and Radiology Results Results for orders placed or performed in visit on 07/25/18 (from the past 72 hour(s))  POCT HgB A1C     Status: Abnormal   Collection Time: 07/25/18  8:02 AM  Result Value Ref  Range   Hemoglobin A1C 6.9 (A) 4.0 - 5.6 %   HbA1c POC (<> result, manual entry)     HbA1c, POC (prediabetic range)     HbA1c, POC (controlled diabetic range)     No results found.    Assessment and Plan: 72 y.o. male with  Diabetes: Doing well.  A1c well controlled.  Continue lower carbohydrate diet.  This will also help with BMI.  Continue current regimen and recheck in 3 months.  Hyperlipidemia and ASCVD risk.  Continue atorvastatin and aspirin.  Blood pressure controlled.  Recheck in 3 months.  Check fasting labs today.  Prostate cancer screening.  Doing well historically.  Minimally symptomatic.  Check PSA today.  Hand pain: Likely mild synovitis.  Plan for topical diclofenac gel or Aspercreme.  Use heat and gentle hand motion exercises.  Recheck in 3 months or sooner if needed.  Patient had diabetic eye exam performed recently [facility].  Result will be abstracted. Patient continues to decline influenza vaccine.   Orders Placed This Encounter  Procedures  . Lipid Panel w/reflex Direct LDL  . CBC  . COMPLETE METABOLIC PANEL WITH GFR  . PSA  . POCT HgB A1C   Meds ordered this encounter  Medications  . AMBULATORY NON FORMULARY MEDICATION    Sig: Glucometer of choice with test strips. Dispense QS x3 months. Test daily.  Controlled DM E11.69    Dispense:  100 each    Refill:  12     Historical information moved to improve visibility of documentation.  Past Medical History:  Diagnosis Date  . Diabetes mellitus without complication ([place])   . Hyperlipidemia    Past Surgical History:  Procedure Laterality Date  . EYE SURGERY    . KNEE SURGERY    . LEG SURGERY     Social History   Tobacco Use  . Smoking status: Never Smoker  . Smokeless tobacco: Never Used  Substance Use Topics  . Alcohol use: Yes    Alcohol/week: 0.0 standard drinks   family history includes Cancer in his brother, father, and sister; Diabetes in his mother.  Medications: Current  Outpatient Medications  Medication Sig Dispense Refill  . AMBULATORY NON FORMULARY MEDICATION Lancets qs 1 daily testing for 90 days.  DM E11.69 Disp 90 day supply 1 each 99  . AMBULATORY NON FORMULARY MEDICATION Glucometer of choice with test strips. Dispense QS x3 months. Test daily.  Controlled DM E11.69 100 each 12  . aspirin 81 MG tablet Take 81 mg by mouth daily.    Marland Kitchen atorvastatin (LIPITOR) 40 MG tablet Take 1 tablet (40 mg total) by mouth daily. 90 tablet 3  . azelastine (ASTELIN) 0.1 % nasal spray PLACE 2 SPRAYS INTO BOTH NOSTRILS 2 (TWO) TIMES DAILY. USE IN EACH NOSTRIL AS DIRECTED 90 mL 4  . Calcium Citrate-Vitamin D (CALCIUM + D PO) Take by mouth.    . Cholecalciferol (D3 ADULT PO) Take by mouth.    . Dapagliflozin-metFORMIN HCl ER (XIGDUO XR) 05-999 MG TB24 Take 1 tablet by mouth daily. 90 tablet 3  . diclofenac sodium (VOLTAREN) 1 % GEL Apply 2 g topically 4 (four) times daily. To affected joint. 100 g 11  . Dulaglutide (TRULICITY) 4.43 XV/4.0GQ SOPN Inject 0.5 mLs into the skin once a week. 12 pen 3  . fluticasone (FLONASE) 50 MCG/ACT nasal spray Place 2 sprays into both nostrils daily. 16 g 2  . hydrocortisone (ANUSOL-HC) 2.5 % rectal cream Place 1 application rectally 2 (two) times daily. 30 g 2  . ibuprofen (ADVIL,MOTRIN) 200 MG tablet Take 200 mg by mouth every 6 (six) hours as needed.    . Multiple Vitamins-Minerals (MULTIVITAMIN PO) Take by mouth.    . Omega-3 Fatty Acids (FISH OIL PO) Take by mouth.     No current facility-administered medications for this visit.    No Known Allergies   Discussed warning signs or symptoms. Please see discharge instructions. Patient expresses understanding.

## 2018-08-07 ENCOUNTER — Encounter: Payer: Self-pay | Admitting: Family Medicine

## 2018-09-17 ENCOUNTER — Other Ambulatory Visit: Payer: Self-pay | Admitting: Family Medicine

## 2018-10-09 ENCOUNTER — Telehealth: Payer: Self-pay | Admitting: Family Medicine

## 2018-10-09 MED ORDER — DAPAGLIFLOZIN PRO-METFORMIN ER 10-1000 MG PO TB24: 1.0000 | ORAL_TABLET | Freq: Every day | ORAL | 3 refills | Status: DC

## 2018-10-09 NOTE — Telephone Encounter (Signed)
Xigduo XR refilled

## 2018-10-13 ENCOUNTER — Telehealth: Payer: Self-pay | Admitting: Family Medicine

## 2018-10-13 NOTE — Telephone Encounter (Signed)
Routing to provider  

## 2018-10-13 NOTE — Telephone Encounter (Signed)
Billy Armstrong is not covered with new RX ins for 2020.  [facility]  ID [id number] RX BIN [id number].  [address] [place] [address].  [phone] PH.   CARD SCANNED. Pt request send 90 day supply to [facility]:  SynJardy or SynJardy XR, or Invokana, or InvokaMet, or Invokamet XR.  Per pt [facility] will NOT cover Xiduo but will cover one of these other meds. Pt states he only has two days supply left of Xiduo at home. Please send new script ASAP.

## 2018-10-14 MED ORDER — EMPAGLIFLOZIN-METFORMIN HCL ER 25-1000 MG PO TB24: 1.0000 | ORAL_TABLET | Freq: Every day | ORAL | 1 refills | Status: DC

## 2018-10-14 NOTE — Telephone Encounter (Signed)
Patient advised of recommendations.  

## 2018-10-14 NOTE — Telephone Encounter (Signed)
Sent in [place] to [place].  Coupon available online or at office for [place] (or [place])

## 2018-10-24 ENCOUNTER — Encounter: Payer: Self-pay | Admitting: Family Medicine

## 2018-10-24 ENCOUNTER — Ambulatory Visit (INDEPENDENT_AMBULATORY_CARE_PROVIDER_SITE_OTHER): Payer: BLUE CROSS/BLUE SHIELD | Admitting: Family Medicine

## 2018-10-24 VITALS — BP 117/76 | HR 69 | Wt 215.0 lb

## 2018-10-24 DIAGNOSIS — E1169 Type 2 diabetes mellitus with other specified complication: Secondary | ICD-10-CM

## 2018-10-24 DIAGNOSIS — E782 Mixed hyperlipidemia: Secondary | ICD-10-CM | POA: Diagnosis not present

## 2018-10-24 DIAGNOSIS — L219 Seborrheic dermatitis, unspecified: Secondary | ICD-10-CM | POA: Insufficient documentation

## 2018-10-24 DIAGNOSIS — E785 Hyperlipidemia, unspecified: Secondary | ICD-10-CM | POA: Diagnosis not present

## 2018-10-24 HISTORY — DX: Seborrheic dermatitis, unspecified: L21.9

## 2018-10-24 MED ORDER — AMBULATORY NON FORMULARY MEDICATION: 99 refills | Status: DC

## 2018-10-24 MED ORDER — KETOCONAZOLE 2 % EX CREA: 1.0000 "application " | TOPICAL_CREAM | Freq: Two times a day (BID) | CUTANEOUS | 3 refills | Status: DC

## 2018-10-24 MED ORDER — GLUCOSE BLOOD VI STRP: ORAL_STRIP | 12 refills | Status: DC

## 2018-10-24 NOTE — Patient Instructions (Signed)
I believe the scalp lesion is seborrheic dermatitis.  Plan to treat with hydrocortisone cream over-the-counter and ketoconazole cream.  Continue diet exercise and medications for blood pressure diabetes and cholesterol.  Recheck in 3 months as scheduled.

## 2018-10-24 NOTE — Progress Notes (Signed)
Virtual Visit via Video Note  I connected with Billy Armstrong on 10/24/18 at 730am by a video enabled telemedicine application and verified that I am speaking with the correct person using two identifiers.   I discussed the limitations of evaluation and management by telemedicine and the availability of in person appointments. The patient expressed understanding and agreed to proceed.  History of Present Illness: Due to COVID-19 pandemic this visit was conducted over the phone.  Vital signs and weight were obtained at the patient's home with reliable scale and blood pressure monitor.  Diabetes: [name] takes [place] and [employer].  He previously was on [place] and switch to [place] due to insurance reasons earlier this month.  He tolerates the medication well and notes that his home blood pressure logs have been well controlled.  He notes that his insurance needs to have been switched to the One Touch Verio Meter.  He tests his blood sugars daily.  He denies polyuria or polydipsia.  Hyperlipidemia: [name] takes atorvastatin and aspirin daily.  He tolerates the medication well with no issues.  He denies any muscle aches or pains.  [name] notes a scaly patch in his hairline present for about 1 month.  He notes it is mildly itchy and occasionally painful.  He feels well otherwise with no fevers or chills nausea vomiting or diarrhea.   Observations/Objective: :  BP 117/76   Pulse 69   Wt 215 lb (97.5 kg)   BMI 29.16 kg/m  Wt Readings from Last 5 Encounters:  10/24/18 215 lb (97.5 kg)  07/25/18 215 lb (97.5 kg)  04/21/18 206 lb (93.4 kg)  02/03/18 203 lb (92.1 kg)  01/06/18 210 lb (95.3 kg)   Skin via picture        Assessment and Plan: : 73 y.o. male with  Diabetes: Doing quite well.  Blood sugar controlled and A1c has been historically controlled.  Will defer A1c for 3 months due to the COVID-19.  Continue current management including exercise and decrease carb diet. Recheck in 3 months.   Hyperlipidemia: Historically doing quite well.  Continue current regimen.  Skin lesion: Appears to be seborrheic dermatitis.  Recommend hydrocortisone cream over-the-counter and ketoconazole cream.  PDMP reviewed during this encounter.   Follow Up Instructions:  I discussed the assessment and treatment plan with the patient. The patient was provided an opportunity to ask questions and all were answered. The patient agreed with the plan and demonstrated an understanding of the instructions.   The patient was advised to call back or seek an in-person evaluation if the symptoms worsen or if the condition fails to improve as anticipated.  I provided 25 minutes of non-face-to-face time during this encounter.   Lynne Leader, MD

## 2018-12-25 ENCOUNTER — Telehealth: Payer: Self-pay | Admitting: Family Medicine

## 2018-12-25 NOTE — Telephone Encounter (Signed)
Patient advised that since his appt is going to be in office- no need to do labs prior to visit

## 2018-12-25 NOTE — Telephone Encounter (Signed)
Patient wanted to know if he needed to get his A1C checked before his appointment on 06/22. Please advise.

## 2019-01-26 ENCOUNTER — Encounter: Payer: Self-pay | Admitting: Family Medicine

## 2019-01-26 ENCOUNTER — Ambulatory Visit: Payer: BC Managed Care – PPO | Admitting: Family Medicine

## 2019-01-26 VITALS — BP 120/72 | HR 77 | Temp 97.9°F | Wt 216.0 lb

## 2019-01-26 DIAGNOSIS — L570 Actinic keratosis: Secondary | ICD-10-CM | POA: Diagnosis not present

## 2019-01-26 DIAGNOSIS — E1169 Type 2 diabetes mellitus with other specified complication: Secondary | ICD-10-CM

## 2019-01-26 DIAGNOSIS — E1165 Type 2 diabetes mellitus with hyperglycemia: Secondary | ICD-10-CM

## 2019-01-26 DIAGNOSIS — E785 Hyperlipidemia, unspecified: Secondary | ICD-10-CM

## 2019-01-26 DIAGNOSIS — E782 Mixed hyperlipidemia: Secondary | ICD-10-CM

## 2019-01-26 LAB — POCT UA - MICROALBUMIN
Albumin/Creatinine Ratio, Urine, POC: <30
Creatinine, POC: 100 mg/dL
Microalbumin Ur, POC: 10 mg/L

## 2019-01-26 NOTE — Patient Instructions (Signed)
Thank you for coming in today.' We will get labs today.  Rechck via video visit in 3 months.  Keep me updated.  Recheck sooner if needed.    Flu vaccine this fall wither with nurse visit or at pharmacy.

## 2019-01-26 NOTE — Progress Notes (Signed)
Billy Armstrong is a 73 y.o. male who presents to [place]: Primary Care Sports Medicine today for diabetes.  Managed with Synjardy and Trulicity.  BS typically 150 fasting with new meter. With old meter lower.   Hyperlipidemia: Managed with atorvastatin and aspirin.  Tolerating well with no muscle aches or pains.  Scalp lesion: Scaly patch on hairline for about 4 months.  Seen 1 month ago and thought to be seborrheic dermatitis.  Treated with ketoconazole cream and hydrocortisone cream.  In the interim Meziah notes that it resolved.   AK: Patient notes a scaly lesion on his forehead.  This is been present over the past few months.  Not particular bothering him.  Increased stress at home. Daughter seperated from husband. Granddaughter living at home.    ROS as above:  Exam:  BP 120/72   Pulse 77   Temp 97.9 F (36.6 C) (Oral)   Wt 216 lb (98 kg)   BMI 29.29 kg/m   Wt Readings from Last 5 Encounters:  01/26/19 216 lb (98 kg)  10/24/18 215 lb (97.5 kg)  07/25/18 215 lb (97.5 kg)  04/21/18 206 lb (93.4 kg)  02/03/18 203 lb (92.1 kg)    Gen: Well NAD HEENT: EOMI,  MMM Lungs: Normal work of breathing. CTABL Heart: RRR no MRG Abd: NABS, Soft. Nondistended, Nontender Exts: Brisk capillary refill, warm and well perfused.  Skin: Small scaly lesion central forehead.  Measures less than 1 cm.  Appearance consistent with actinic keratosis.  Lab and Radiology Results Results for orders placed or performed in visit on 01/26/19 (from the past 72 hour(s))  POCT UA - Microalbumin     Status: Normal   Collection Time: 01/26/19  8:25 AM  Result Value Ref Range   Microalbumin Ur, POC 10 mg mg/L   Creatinine, POC 100 mg mg/dL   Albumin/Creatinine Ratio, Urine, POC <30 mg    No results found.  Cryotherapy Face/forehead lesion consistent with actinic keratosis. Consent obtained and timeout  performed. Lesion treated with liquid nitrogen jet to treat Frost for 15 seconds x 2. Patient tolerated procedure well.  Assessment and Plan: 73 y.o. male with  Diabetes: Doing reasonably well.  Point-of-care hemoglobin A1c failed.  We will proceed with phlebotomy A1c.  Adjust medication as needed.  Facial lesion: Consistent with actinic keratosis.  Treated with cryotherapy today.  Recheck as needed.  Hyperlipidemia: Doing well on last recheck.  Continue current regimen.  Stress: Continue to monitor.  Recheck video visit 3 months.  PDMP not reviewed this encounter. Orders Placed This Encounter  Procedures  . CBC  . Hemoglobin A1c  . POCT UA - Microalbumin   No orders of the defined types were placed in this encounter.    Historical information moved to improve visibility of documentation.  Past Medical History:  Diagnosis Date  . Diabetes mellitus without complication ([place])   . Hyperlipidemia   . Seborrheic dermatitis of scalp 10/24/2018   Past Surgical History:  Procedure Laterality Date  . EYE SURGERY    . KNEE SURGERY    . LEG SURGERY     Social History   Tobacco Use  . Smoking status: Never Smoker  . Smokeless tobacco: Never Used  Substance Use Topics  . Alcohol use: Yes    Alcohol/week: 0.0 standard drinks   family history includes Cancer in his brother, father, and sister; Diabetes in his mother.  Medications: Current Outpatient Medications  Medication Sig Dispense  Refill  . AMBULATORY NON FORMULARY MEDICATION Glucometer of choice with test strips. Dispense QS x3 months. Test daily.  Controlled DM E11.69 100 each 12  . AMBULATORY NON FORMULARY MEDICATION Lancets qs 1 daily testing for 90 days.  DM E11.69 Disp 90 day supply For one touch verio meter 1 each 99  . aspirin 81 MG tablet Take 81 mg by mouth daily.    Marland Kitchen atorvastatin (LIPITOR) 40 MG tablet Take 1 tablet (40 mg total) by mouth daily. 90 tablet 3  . azelastine (ASTELIN) 0.1 % nasal spray PLACE  2 SPRAYS INTO BOTH NOSTRILS 2 (TWO) TIMES DAILY. USE IN EACH NOSTRIL AS DIRECTED 90 mL 4  . Calcium Citrate-Vitamin D (CALCIUM + D PO) Take by mouth.    . Cholecalciferol (D3 ADULT PO) Take by mouth.    . diclofenac sodium (VOLTAREN) 1 % GEL Apply 2 g topically 4 (four) times daily. To affected joint. 100 g 11  . Dulaglutide (TRULICITY) 7.62 GB/1.5VV SOPN Inject 0.5 mLs into the skin once a week. 12 pen 3  . Empagliflozin-metFORMIN HCl ER (SYNJARDY XR) 25-1000 MG TB24 Take 1 tablet by mouth daily. 90 tablet 1  . fluticasone (FLONASE) 50 MCG/ACT nasal spray Place 2 sprays into both nostrils daily. 16 g 2  . glucose blood (ONETOUCH VERIO) test strip For one touch verio meter. Tesr daily 100 each 12  . hydrocortisone (ANUSOL-HC) 2.5 % rectal cream Place 1 application rectally 2 (two) times daily. 30 g 2  . ibuprofen (ADVIL,MOTRIN) 200 MG tablet Take 200 mg by mouth every 6 (six) hours as needed.    Marland Kitchen ketoconazole (NIZORAL) 2 % cream Apply 1 application topically 2 (two) times daily. To affected areas. 60 g 3  . Multiple Vitamins-Minerals (MULTIVITAMIN PO) Take by mouth.    . Omega-3 Fatty Acids (FISH OIL PO) Take by mouth.     No current facility-administered medications for this visit.    No Known Allergies   Discussed warning signs or symptoms. Please see discharge instructions. Patient expresses understanding.

## 2019-01-27 LAB — CBC
HCT: 46.9 % (ref 38.5–50.0)
Hemoglobin: 15.6 g/dL (ref 13.2–17.1)
MCH: 32 pg (ref 27.0–33.0)
MCHC: 33.3 g/dL (ref 32.0–36.0)
MCV: 96.3 fL (ref 80.0–100.0)
MPV: 11.9 fL (ref 7.5–12.5)
Platelets: 195 10*3/uL (ref 140–400)
RBC: 4.87 10*6/uL (ref 4.20–5.80)
RDW: 12.7 % (ref 11.0–15.0)
WBC: 6.6 10*3/uL (ref 3.8–10.8)

## 2019-01-27 LAB — HEMOGLOBIN A1C
Hgb A1c MFr Bld: 7.7 % of total Hgb — ABNORMAL HIGH (ref <5.7)
Mean Plasma Glucose: 174 (calc)
eAG (mmol/L): 9.7 (calc)

## 2019-01-27 MED ORDER — TRULICITY 1.5 MG/0.5ML ~~LOC~~ SOAJ: 1.0000 "pen " | SUBCUTANEOUS | 3 refills | Status: DC

## 2019-01-27 NOTE — Addendum Note (Signed)
Addended by: Gregor Hams on: 01/27/2019 06:44 AM   Modules accepted: Orders

## 2019-02-26 ENCOUNTER — Other Ambulatory Visit: Payer: Self-pay | Admitting: Family Medicine

## 2019-03-30 ENCOUNTER — Other Ambulatory Visit: Payer: Self-pay | Admitting: Family Medicine

## 2019-04-14 ENCOUNTER — Other Ambulatory Visit: Payer: Self-pay | Admitting: Family Medicine

## 2019-04-23 ENCOUNTER — Other Ambulatory Visit: Payer: Self-pay | Admitting: *Deleted

## 2019-04-23 MED ORDER — TRULICITY 1.5 MG/0.5ML ~~LOC~~ SOAJ: 1.0000 "pen " | SUBCUTANEOUS | 3 refills | Status: DC

## 2019-04-27 ENCOUNTER — Encounter: Payer: Self-pay | Admitting: Family Medicine

## 2019-04-27 ENCOUNTER — Other Ambulatory Visit: Payer: Self-pay

## 2019-04-27 ENCOUNTER — Telehealth (INDEPENDENT_AMBULATORY_CARE_PROVIDER_SITE_OTHER): Payer: BC Managed Care – PPO | Admitting: Family Medicine

## 2019-04-27 VITALS — BP 106/69 | HR 68 | Wt 209.0 lb

## 2019-04-27 DIAGNOSIS — Z125 Encounter for screening for malignant neoplasm of prostate: Secondary | ICD-10-CM | POA: Diagnosis not present

## 2019-04-27 DIAGNOSIS — E1169 Type 2 diabetes mellitus with other specified complication: Secondary | ICD-10-CM

## 2019-04-27 DIAGNOSIS — E785 Hyperlipidemia, unspecified: Secondary | ICD-10-CM

## 2019-04-27 DIAGNOSIS — Z9189 Other specified personal risk factors, not elsewhere classified: Secondary | ICD-10-CM | POA: Diagnosis not present

## 2019-04-27 DIAGNOSIS — E782 Mixed hyperlipidemia: Secondary | ICD-10-CM | POA: Diagnosis not present

## 2019-04-27 NOTE — Patient Instructions (Signed)
Thank you for coming in today. Get labs on or after Dec 20th. Labs should be fasting.  Recheck in 6 months.   Get flu vaccine with nurse visit or at a pharmacy.   I will be moving to full time Sports Medicine in [place] starting on November 1st.  You will still be able to see me for your Sports Medicine or Orthopedic needs at [facility] in [place]. I will still be part of [facility].    If you want to stay locally for your Sports Medicine issues Dr. Dianah Field here in [place] will be happy to see you.  Additionally Dr. Clearance Coots at [facility] will be happy to see you for sports medicine issues more locally.   For your primary care needs you are welcome to establish care with Dr. Emeterio Reeve.  We are working quickly to hire more physicians to cover the primary care needs however if you cannot get an appointment with Dr. Sheppard Coil in a timely manner [facility] has locations and openings for primary care services nearby.   [facility] at [facility] [address] . [employer] [place] , [place]: [phone] . Behavioral Medicine: [phone] . Fax: [place] at [employer] [address] . [place], [place]: [phone] . Behavioral Medicine: [phone] . Fax: [phone] . Hours (M-F): 7am - Academic librarian At [facility]. [place], [place]: [phone] . Behavioral Medicine: [phone] . Fax: [phone] . Hours (M-F): 8am - Optician, dispensing at [employer] . [place], [place] Phone: [phone] . Behavioral Medicine: [phone] . Fax: [phone]

## 2019-04-27 NOTE — Progress Notes (Signed)
Virtual Visit  via Video Note  I connected with      Franki Monte by a video enabled telemedicine application and verified that I am speaking with the correct person using two identifiers.   I discussed the limitations of evaluation and management by telemedicine and the availability of in person appointments. The patient expressed understanding and agreed to proceed.  History of Present Illness: Billy Armstrong is a 73 y.o. male who would like to discuss follow-up diabetes hyperlipidemia actinic keratosis.  DM: At the last visit A1c was 7.7.  Trulicity was increased from 0.75-1.5.  He tolerates it well with no issues.  He also takes Synjardy XL.  Blood sugars fasting are usually in the 130s or so.  He denies any polyuria or polydipsia and is happy with how things are going.    HLD: Tolerating atorvastatin well without any muscle pains or aches.  He is trying to be careful about his diet.  Patient had cryosurgery for actinic keratosis at last visit.  He notes it has healed up well and not returned.  BMI 28 Exercise 6000-7000 steps per day.  Walking.  Also try to be careful with diet.    Observations/Objective: BP 106/69   Pulse 68   Wt 209 lb (94.8 kg)   BMI 28.35 kg/m  Wt Readings from Last 5 Encounters:  04/27/19 209 lb (94.8 kg)  01/26/19 216 lb (98 kg)  10/24/18 215 lb (97.5 kg)  07/25/18 215 lb (97.5 kg)  04/21/18 206 lb (93.4 kg)   Exam: Appearance nontoxic appearing Normal Speech.    Lab and Radiology Results No results found for this or any previous visit (from the past 72 hour(s)). No results found.   Assessment and Plan: 73 y.o. male with  Diabetes: Doing well.  Fasting sugars improved.  Will check A1c with fasting labs late December.  Recheck in 6 months.  Hyperlipidemia: Doing well.  Check fasting lipid panel late December.  Labs ordered and reminder programmed.  Recheck 6 months.  Actinic keratosis doing well watchful waiting.  BMI 28: Diet and  exercise discussed.  With fasting labs or go ahead and screen PSA for prostate cancer screening.  PDMP not reviewed this encounter. Orders Placed This Encounter  Procedures  . CBC  . COMPLETE METABOLIC PANEL WITH GFR  . Lipid Panel w/reflex Direct LDL  . PSA  . Hemoglobin A1c   No orders of the defined types were placed in this encounter.   Follow Up Instructions:    I discussed the assessment and treatment plan with the patient. The patient was provided an opportunity to ask questions and all were answered. The patient agreed with the plan and demonstrated an understanding of the instructions.   The patient was advised to call back or seek an in-person evaluation if the symptoms worsen or if the condition fails to improve as anticipated.  Time: 25 minutes of intraservice time, with >39 minutes of total time during today's visit.      Historical information moved to improve visibility of documentation.  Past Medical History:  Diagnosis Date  . Diabetes mellitus without complication ([place])   . Hyperlipidemia   . Seborrheic dermatitis of scalp 10/24/2018   Past Surgical History:  Procedure Laterality Date  . EYE SURGERY    . KNEE SURGERY    . LEG SURGERY     Social History   Tobacco Use  . Smoking status: Never Smoker  . Smokeless tobacco: Never Used  Substance  Use Topics  . Alcohol use: Yes    Alcohol/week: 0.0 standard drinks   family history includes Cancer in his brother, father, and sister; Diabetes in his mother.  Medications: Current Outpatient Medications  Medication Sig Dispense Refill  . AMBULATORY NON FORMULARY MEDICATION Glucometer of choice with test strips. Dispense QS x3 months. Test daily.  Controlled DM E11.69 100 each 12  . AMBULATORY NON FORMULARY MEDICATION Lancets qs 1 daily testing for 90 days.  DM E11.69 Disp 90 day supply For one touch verio meter 1 each 99  . aspirin 81 MG tablet Take 81 mg by mouth daily.    Marland Kitchen atorvastatin (LIPITOR)  40 MG tablet TAKE 1 TABLET BY MOUTH EVERY DAY 90 tablet 3  . azelastine (ASTELIN) 0.1 % nasal spray PLACE 2 SPRAYS INTO BOTH NOSTRILS 2 (TWO) TIMES DAILY. USE IN EACH NOSTRIL AS DIRECTED 90 mL 4  . Calcium Citrate-Vitamin D (CALCIUM + D PO) Take by mouth.    . Cholecalciferol (D3 ADULT PO) Take by mouth.    . diclofenac sodium (VOLTAREN) 1 % GEL Apply 2 g topically 4 (four) times daily. To affected joint. 100 g 11  . Dulaglutide (TRULICITY) 1.5 0000000 SOPN Inject 1 pen into the skin once a week. 12 pen 3  . fluticasone (FLONASE) 50 MCG/ACT nasal spray Place 2 sprays into both nostrils daily. 16 g 2  . glucose blood (ONETOUCH VERIO) test strip For one touch verio meter. Tesr daily 100 each 12  . hydrocortisone (ANUSOL-HC) 2.5 % rectal cream Place 1 application rectally 2 (two) times daily. 30 g 2  . ibuprofen (ADVIL,MOTRIN) 200 MG tablet Take 200 mg by mouth every 6 (six) hours as needed.    Marland Kitchen ketoconazole (NIZORAL) 2 % cream Apply 1 application topically 2 (two) times daily. To affected areas. 60 g 3  . Multiple Vitamins-Minerals (MULTIVITAMIN PO) Take by mouth.    . Omega-3 Fatty Acids (FISH OIL PO) Take by mouth.    Marland Kitchen SYNJARDY XR 25-1000 MG TB24 TAKE 1 TABLET BY MOUTH EVERY DAY 90 tablet 1   No current facility-administered medications for this visit.    No Known Allergies

## 2019-09-09 ENCOUNTER — Other Ambulatory Visit: Payer: Self-pay | Admitting: Family Medicine

## 2020-01-16 ENCOUNTER — Other Ambulatory Visit: Payer: Self-pay | Admitting: Family Medicine

## 2020-03-04 ENCOUNTER — Other Ambulatory Visit: Payer: Self-pay | Admitting: Osteopathic Medicine

## 2020-03-08 ENCOUNTER — Other Ambulatory Visit: Payer: Self-pay | Admitting: Family Medicine

## 2020-03-27 ENCOUNTER — Other Ambulatory Visit: Payer: Self-pay | Admitting: Osteopathic Medicine

## 2020-03-28 NOTE — Telephone Encounter (Signed)
Needs to establish with new PCP

## 2020-03-28 NOTE — Telephone Encounter (Signed)
Appointment has been made

## 2020-03-31 ENCOUNTER — Ambulatory Visit (INDEPENDENT_AMBULATORY_CARE_PROVIDER_SITE_OTHER): Payer: BC Managed Care – PPO | Admitting: Family Medicine

## 2020-03-31 ENCOUNTER — Encounter: Payer: Self-pay | Admitting: Family Medicine

## 2020-03-31 ENCOUNTER — Other Ambulatory Visit: Payer: Self-pay

## 2020-03-31 VITALS — BP 107/65 | HR 70 | Temp 98.1°F | Ht 72.0 in | Wt 200.9 lb

## 2020-03-31 DIAGNOSIS — F4381 Prolonged grief disorder: Secondary | ICD-10-CM

## 2020-03-31 DIAGNOSIS — E119 Type 2 diabetes mellitus without complications: Secondary | ICD-10-CM | POA: Insufficient documentation

## 2020-03-31 DIAGNOSIS — E785 Hyperlipidemia, unspecified: Secondary | ICD-10-CM

## 2020-03-31 DIAGNOSIS — F4329 Adjustment disorder with other symptoms: Secondary | ICD-10-CM | POA: Diagnosis not present

## 2020-03-31 DIAGNOSIS — E1169 Type 2 diabetes mellitus with other specified complication: Secondary | ICD-10-CM

## 2020-03-31 MED ORDER — TRULICITY 1.5 MG/0.5ML ~~LOC~~ SOAJ: 1.5000 mg | SUBCUTANEOUS | 2 refills | Status: DC

## 2020-03-31 MED ORDER — SYNJARDY XR 25-1000 MG PO TB24: 1.0000 | ORAL_TABLET | Freq: Every day | ORAL | 2 refills | Status: DC

## 2020-03-31 NOTE — Patient Instructions (Signed)
Nice to meet you today! Have labs completed Let's plan for follow up in 6 months.  If labs are abnormal we may need to follow up sooner.

## 2020-03-31 NOTE — Assessment & Plan Note (Signed)
Encouraged to work on dietary and lifestyle changes.  Update labs including a1c today.  Continue current medications.

## 2020-03-31 NOTE — Progress Notes (Signed)
[name] - 73 y.o. male MRN [id number]  Date of birth: December 01, 1945  Subjective Chief Complaint  Patient presents with  . Establish Care    HPI Billy Armstrong is a 73 y.o. male here today for follow up visits.  He has a history of T2DM, HLD  And low testosterone.  He unfortunately lost his wife in April due to pancreatic cancer.  He has good days and bad days.  He has good support from family.  He doesn't feel like he wants to start any medication or see a counselor at this time.    His diabetes has been managed with trulicity and synjardy.  He is tolerating this well.  He reports that home blood sugar readings have been around 140.  He admits his diet hasn't been very good since his wife passed away.  He has not had any symptoms of hypoglycemia.   Cholesterol is managed with lipitor 40mg    Toelrating well without myalgias.   ROS:  A comprehensive ROS was completed and negative except as noted per HPI  No Known Allergies  Past Medical History:  Diagnosis Date  . Diabetes mellitus without complication ([place])   . Hyperlipidemia   . Seborrheic dermatitis of scalp 10/24/2018    Past Surgical History:  Procedure Laterality Date  . EYE SURGERY    . KNEE SURGERY    . LEG SURGERY      Social History   Socioeconomic History  . Marital status: Married    Spouse name: Not on file  . Number of children: Not on file  . Years of education: Not on file  . Highest education level: Not on file  Occupational History  . Not on file  Tobacco Use  . Smoking status: Never Smoker  . Smokeless tobacco: Never Used  Substance and Sexual Activity  . Alcohol use: Yes    Alcohol/week: 0.0 standard drinks  . Drug use: Yes  . Sexual activity: Not on file  Other Topics Concern  . Not on file  Social History Narrative  . Not on file   Social Determinants of Health   Financial Resource Strain:   . Difficulty of Paying Living Expenses: Not on file  Food Insecurity:   . Worried About Paediatric nurse in the Last Year: Not on file  . Ran Out of Food in the Last Year: Not on file  Transportation Needs:   . Lack of Transportation (Medical): Not on file  . Lack of Transportation (Non-Medical): Not on file  Physical Activity:   . Days of Exercise per Week: Not on file  . Minutes of Exercise per Session: Not on file  Stress:   . Feeling of Stress : Not on file  Social Connections:   . Frequency of Communication with Friends and Family: Not on file  . Frequency of Social Gatherings with Friends and Family: Not on file  . Attends Religious Services: Not on file  . Active Member of Clubs or Organizations: Not on file  . Attends Archivist Meetings: Not on file  . Marital Status: Not on file    Family History  Problem Relation Age of Onset  . Diabetes Mother   . Cancer Father   . Cancer Sister   . Cancer Brother     Health Maintenance  Topic Date Due  . COVID-19 Vaccine (1) Never done  . OPHTHALMOLOGY EXAM  06/27/2019  . HEMOGLOBIN A1C  07/28/2019  . COLONOSCOPY  08/24/2019  .  FOOT EXAM  01/26/2020  . URINE MICROALBUMIN  01/26/2020  . INFLUENZA VACCINE  03/06/2020  . TETANUS/TDAP  02/26/2026  . Hepatitis C Screening  Completed  . PNA vac Low Risk Adult  Completed     ----------------------------------------------------------------------------------------------------------------------------------------------------------------------------------------------------------------- Physical Exam BP 107/65 (BP Location: Left Arm, Patient Position: Sitting, Cuff Size: Normal)   Pulse 70   Temp 98.1 F (36.7 C) (Oral)   Ht 6' (1.829 m)   Wt 200 lb 14.4 oz (91.1 kg)   SpO2 99%   BMI 27.25 kg/m   Physical Exam Constitutional:      Appearance: Normal appearance.  HENT:     Head: Normocephalic and atraumatic.  Eyes:     General: No scleral icterus. Cardiovascular:     Rate and Rhythm: Normal rate and regular rhythm.  Pulmonary:     Effort: Pulmonary  effort is normal.     Breath sounds: Normal breath sounds.  Musculoskeletal:     Cervical back: Neck supple.  Neurological:     Mental Status: He is alert.  Psychiatric:        Mood and Affect: Mood normal.        Behavior: Behavior normal.     ------------------------------------------------------------------------------------------------------------------------------------------------------------------------------------------------------------------- Assessment and Plan  Hyperlipidemia associated with type 2 diabetes mellitus ([place]) He is tolerating atorvastatin well.  Update lipids/cmp  Diabetes mellitus ([place]) Encouraged to work on dietary and lifestyle changes.  Update labs including a1c today.  Continue current medications.   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  He will let me know if he decides to pursue this.     Meds ordered this encounter  Medications  . Dulaglutide (TRULICITY) 1.5 VW/0.9WJ SOPN    Sig: Inject 0.5 mLs (1.5 mg total) into the skin once a week.    Dispense:  6 mL    Refill:  2  . Empagliflozin-metFORMIN HCl ER (SYNJARDY XR) 25-1000 MG TB24    Sig: Take 1 tablet by mouth daily.    Dispense:  90 tablet    Refill:  2    Return in about 6 months (around 10/01/2020) for T2DM.    This visit occurred during the SARS-CoV-2 public health emergency.  Safety protocols were in place, including screening questions prior to the visit, additional usage of staff PPE, and extensive cleaning of exam room while observing appropriate contact time as indicated for disinfecting solutions.

## 2020-03-31 NOTE — Assessment & Plan Note (Signed)
Declines referral for counseling or medication at this time.  He will let me know if he decides to pursue this.

## 2020-03-31 NOTE — Assessment & Plan Note (Signed)
He is tolerating atorvastatin well.  Update lipids/cmp

## 2020-04-01 LAB — CBC
HCT: 42.9 % (ref 38.5–50.0)
Hemoglobin: 14.6 g/dL (ref 13.2–17.1)
MCH: 32.4 pg (ref 27.0–33.0)
MCHC: 34 g/dL (ref 32.0–36.0)
MCV: 95.3 fL (ref 80.0–100.0)
MPV: 11.9 fL (ref 7.5–12.5)
Platelets: 197 10*3/uL (ref 140–400)
RBC: 4.5 10*6/uL (ref 4.20–5.80)
RDW: 11.9 % (ref 11.0–15.0)
WBC: 6.3 10*3/uL (ref 3.8–10.8)

## 2020-04-01 LAB — LIPID PANEL
Cholesterol: 99 mg/dL (ref <200)
HDL: 44 mg/dL (ref >=40)
LDL Cholesterol (Calc): 42 mg/dL (calc)
Non-HDL Cholesterol (Calc): 55 mg/dL (calc) (ref <130)
Total CHOL/HDL Ratio: 2.3 (calc) (ref <5.0)
Triglycerides: 58 mg/dL (ref <150)

## 2020-04-01 LAB — COMPLETE METABOLIC PANEL WITH GFR
AG Ratio: 1.8 (calc) (ref 1.0–2.5)
ALT: 19 U/L (ref 9–46)
AST: 16 U/L (ref 10–35)
Albumin: 4.3 g/dL (ref 3.6–5.1)
Alkaline phosphatase (APISO): 80 U/L (ref 35–144)
BUN/Creatinine Ratio: 29 (calc) — ABNORMAL HIGH (ref 6–22)
BUN: 20 mg/dL (ref 7–25)
CO2: 29 mmol/L (ref 20–32)
Calcium: 9.6 mg/dL (ref 8.6–10.3)
Chloride: 103 mmol/L (ref 98–110)
Creat: 0.68 mg/dL — ABNORMAL LOW (ref 0.70–1.18)
GFR, Est African American: 111 mL/min/{1.73_m2} (ref >=60)
GFR, Est Non African American: 95 mL/min/{1.73_m2} (ref >=60)
Globulin: 2.4 g/dL (calc) (ref 1.9–3.7)
Glucose, Bld: 169 mg/dL — ABNORMAL HIGH (ref 65–99)
Potassium: 4.1 mmol/L (ref 3.5–5.3)
Sodium: 140 mmol/L (ref 135–146)
Total Bilirubin: 0.7 mg/dL (ref 0.2–1.2)
Total Protein: 6.7 g/dL (ref 6.1–8.1)

## 2020-04-01 LAB — MICROALBUMIN / CREATININE URINE RATIO
Creatinine, Urine: 62 mg/dL (ref 20–320)
Microalb Creat Ratio: 3 mcg/mg creat (ref <30)
Microalb, Ur: 0.2 mg/dL

## 2020-04-01 LAB — HEMOGLOBIN A1C
Hgb A1c MFr Bld: 9.2 % of total Hgb — ABNORMAL HIGH (ref <5.7)
Mean Plasma Glucose: 217 (calc)
eAG (mmol/L): 12 (calc)

## 2020-04-06 ENCOUNTER — Other Ambulatory Visit: Payer: Self-pay | Admitting: Family Medicine

## 2020-04-06 MED ORDER — TRULICITY 3 MG/0.5ML ~~LOC~~ SOAJ: 3.0000 mg | SUBCUTANEOUS | 2 refills | Status: AC

## 2020-07-08 LAB — HM DIABETES EYE EXAM

## 2020-07-12 ENCOUNTER — Emergency Department (HOSPITAL_BASED_OUTPATIENT_CLINIC_OR_DEPARTMENT_OTHER)
Admission: EM | Admit: 2020-07-12 | Discharge: 2020-07-12 | Disposition: A | Discharge status: Home / Self Care | Payer: BC Managed Care – PPO | Attending: Emergency Medicine | Admitting: Emergency Medicine

## 2020-07-12 ENCOUNTER — Emergency Department (HOSPITAL_BASED_OUTPATIENT_CLINIC_OR_DEPARTMENT_OTHER): Payer: BC Managed Care – PPO

## 2020-07-12 ENCOUNTER — Encounter (HOSPITAL_BASED_OUTPATIENT_CLINIC_OR_DEPARTMENT_OTHER): Payer: Self-pay

## 2020-07-12 ENCOUNTER — Other Ambulatory Visit: Payer: Self-pay

## 2020-07-12 DIAGNOSIS — R6883 Chills (without fever): Secondary | ICD-10-CM | POA: Diagnosis present

## 2020-07-12 DIAGNOSIS — E119 Type 2 diabetes mellitus without complications: Secondary | ICD-10-CM | POA: Diagnosis not present

## 2020-07-12 DIAGNOSIS — Z7982 Long term (current) use of aspirin: Secondary | ICD-10-CM | POA: Insufficient documentation

## 2020-07-12 DIAGNOSIS — U071 COVID-19: Secondary | ICD-10-CM | POA: Insufficient documentation

## 2020-07-12 LAB — COMPREHENSIVE METABOLIC PANEL
ALT: 26 U/L (ref 0–44)
AST: 24 U/L (ref 15–41)
Albumin: 3.9 g/dL (ref 3.5–5.0)
Alkaline Phosphatase: 74 U/L (ref 38–126)
Anion gap: 9 (ref 5–15)
BUN: 21 mg/dL (ref 8–23)
CO2: 27 mmol/L (ref 22–32)
Calcium: 8.7 mg/dL — ABNORMAL LOW (ref 8.9–10.3)
Chloride: 101 mmol/L (ref 98–111)
Creatinine, Ser: 0.69 mg/dL (ref 0.61–1.24)
GFR, Estimated: >60 mL/min (ref >60)
Glucose, Bld: 145 mg/dL — ABNORMAL HIGH (ref 70–99)
Potassium: 3.8 mmol/L (ref 3.5–5.1)
Sodium: 137 mmol/L (ref 135–145)
Total Bilirubin: 0.8 mg/dL (ref 0.3–1.2)
Total Protein: 6.7 g/dL (ref 6.5–8.1)

## 2020-07-12 LAB — RESP PANEL BY RT-PCR (FLU A&B, COVID) ARPGX2
Influenza A by PCR: NEGATIVE
Influenza B by PCR: NEGATIVE
SARS Coronavirus 2 by RT PCR: POSITIVE — AB

## 2020-07-12 LAB — FERRITIN: Ferritin: 141 ng/mL (ref 24–336)

## 2020-07-12 LAB — CBC WITH DIFFERENTIAL/PLATELET
Abs Immature Granulocytes: 0.02 10*3/uL (ref 0.00–0.07)
Basophils Absolute: 0 10*3/uL (ref 0.0–0.1)
Basophils Relative: 0 %
Eosinophils Absolute: 0 10*3/uL (ref 0.0–0.5)
Eosinophils Relative: 0 %
HCT: 37.8 % — ABNORMAL LOW (ref 39.0–52.0)
Hemoglobin: 13.1 g/dL (ref 13.0–17.0)
Immature Granulocytes: 0 %
Lymphocytes Relative: 11 %
Lymphs Abs: 0.8 10*3/uL (ref 0.7–4.0)
MCH: 32.6 pg (ref 26.0–34.0)
MCHC: 34.7 g/dL (ref 30.0–36.0)
MCV: 94 fL (ref 80.0–100.0)
Monocytes Absolute: 1.2 10*3/uL — ABNORMAL HIGH (ref 0.1–1.0)
Monocytes Relative: 17 %
Neutro Abs: 5.2 10*3/uL (ref 1.7–7.7)
Neutrophils Relative %: 72 %
Platelets: 150 10*3/uL (ref 150–400)
RBC: 4.02 MIL/uL — ABNORMAL LOW (ref 4.22–5.81)
RDW: 13.1 % (ref 11.5–15.5)
WBC: 7.2 10*3/uL (ref 4.0–10.5)
nRBC: 0 % (ref 0.0–0.2)

## 2020-07-12 LAB — CBG MONITORING, ED: Glucose-Capillary: 149 mg/dL — ABNORMAL HIGH (ref 70–99)

## 2020-07-12 LAB — LACTATE DEHYDROGENASE: LDH: 145 U/L (ref 98–192)

## 2020-07-12 LAB — TRIGLYCERIDES: Triglycerides: 26 mg/dL (ref <150)

## 2020-07-12 LAB — PROCALCITONIN: Procalcitonin: 0.1 ng/mL

## 2020-07-12 LAB — LACTIC ACID, PLASMA: Lactic Acid, Venous: 0.9 mmol/L (ref 0.5–1.9)

## 2020-07-12 LAB — FIBRINOGEN: Fibrinogen: 380 mg/dL (ref 210–475)

## 2020-07-12 LAB — D-DIMER, QUANTITATIVE: D-Dimer, Quant: 0.39 ug/mL-FEU (ref 0.00–0.50)

## 2020-07-12 LAB — C-REACTIVE PROTEIN: CRP: 1 mg/dL — ABNORMAL HIGH (ref <1.0)

## 2020-07-12 MED ORDER — SODIUM CHLORIDE 0.9 % IV BOLUS
500.0000 mL | Freq: Once | INTRAVENOUS | Status: AC
  Administered 2020-07-12: 500 mL via INTRAVENOUS

## 2020-07-12 MED ORDER — ACETAMINOPHEN 325 MG PO TABS
650.0000 mg | ORAL_TABLET | Freq: Once | ORAL | Status: AC
  Administered 2020-07-12: 650 mg via ORAL
  Filled 2020-07-12: qty 2

## 2020-07-12 NOTE — ED Provider Notes (Signed)
Patient care assumed at 1500. Patient with history of diabetes, hyperlipidemia here with fever, body aches and chills for the last two days. He reports fevers to 101 at home. He has no respiratory symptoms of this tolerating oral's well. ED visit significant for hypotension. Blood pressure did improve after fluid boluses. His baseline blood pressure is 030 to 092 systolic and he does not have a history of hypertension. On reassessment after IV fluids patient is feeling well and able to ambulate without difficulty. Plan to discharge home with referral to [facility] infusion [facility]. Return precautions discussed   Quintella Reichert, MD 07/12/20 1810

## 2020-07-12 NOTE — ED Notes (Signed)
Pt endorses cough, sore throat, fever, chills and generalized body aches x 3 days. Pt reports not vaccinated for covid.

## 2020-07-12 NOTE — ED Notes (Signed)
CBG 149. 

## 2020-07-12 NOTE — ED Triage Notes (Signed)
Pt complaint of I have has chills and body aches with fever for 3 days with cough for 3 days.

## 2020-07-12 NOTE — ED Provider Notes (Signed)
[facility] EMERGENCY DEPARTMENT Provider Note   CSN: [id number] Arrival date & time: 07/12/20  7824     History Chief Complaint  Patient presents with  . Fever    Billy Armstrong is a 74 y.o. male.  Patient is a 74 year old male with a history of diabetes and hyperlipidemia who presents with chills and body aches.  He has had a 2-day history of body aches with fevers up to 101.  He has had some runny nose nasal congestion and coughing.  He denies any shortness of breath.  No chest pain.  No abdominal pain.  No nausea or vomiting.  No change in bowels.  No urinary symptoms.  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  He is not vaccinated for Covid.        Past Medical History:  Diagnosis Date  . Diabetes mellitus without complication ([place])   . Hyperlipidemia   . Seborrheic dermatitis of scalp 10/24/2018    Patient Active Problem List   Diagnosis Date Noted  . Diabetes mellitus ([place]) 03/31/2020  . 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 Seborrheic dermatitis of scalp 10/24/2018  . Hyperlipidemia associated with type 2 diabetes mellitus ([place]) 04/21/2018  . Nasal congestion 10/07/2017  . Posterior vitreous detachment of both eyes 06/24/2017  . HLD (hyperlipidemia) 05/29/2016  . 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 Framingham cardiac risk >20% in next 10 years 12/27/2015  . Vitamin D deficiency 12/26/2015    Past Surgical History:  Procedure Laterality Date  . EYE SURGERY    . KNEE SURGERY    . LEG SURGERY         Family History  Problem Relation Age of Onset  . Diabetes Mother   . Cancer Father   . Cancer Sister   . Cancer Brother     Social History   Tobacco Use  . Smoking status: Never Smoker  . Smokeless tobacco: Never Used  Vaping Use  . Vaping Use: Never used  Substance Use Topics  . Alcohol use: Yes    Alcohol/week: 0.0 standard drinks  . Drug use: Yes    Home Medications Prior to Admission  medications   Medication Sig Start Date End Date Taking? Authorizing Provider  Dulaglutide (TRULICITY [place]) Inject 3 mg into the skin.   Yes [provider]  AMBULATORY NON FORMULARY MEDICATION Glucometer of choice with test strips. Dispense QS x3 months. Test daily.  Controlled DM E11.69 07/25/18   Gregor Hams, MD  AMBULATORY NON FORMULARY MEDICATION Lancets qs 1 daily testing for 90 days.  DM E11.69 Disp 90 day supply For one touch verio meter 10/24/18   Gregor Hams, MD  aspirin 81 MG tablet Take 81 mg by mouth daily.    [provider]  atorvastatin (LIPITOR) 40 MG tablet TAKE 1 TABLET BY MOUTH EVERY DAY 03/09/20   Emeterio Reeve, DO  azelastine (ASTELIN) 0.1 % nasal spray PLACE 2 SPRAYS INTO BOTH NOSTRILS 2 (TWO) TIMES DAILY. USE IN [facility] NOSTRIL AS DIRECTED 07/17/18   Gregor Hams, MD  Calcium Citrate-Vitamin D (CALCIUM + D PO) Take by mouth.    [provider]  Cholecalciferol (D3 ADULT PO) Take by mouth.    [provider]  Empagliflozin-metFORMIN HCl ER (SYNJARDY XR) 25-1000 MG TB24 Take 1 tablet by mouth daily. 03/31/20   Luetta Nutting, DO  glucose blood (ONETOUCH VERIO) test strip For one touch verio meter. Tesr daily  10/24/18   Gregor Hams, MD  ibuprofen (ADVIL,MOTRIN) 200 MG tablet Take 200 mg by mouth every 6 (six) hours as needed.    [provider]  Multiple Vitamins-Minerals (MULTIVITAMIN PO) Take by mouth.    [provider]  Omega-3 Fatty Acids (FISH OIL PO) Take by mouth.    [provider]    Allergies    Patient has no known allergies.  Review of Systems   Review of Systems  Constitutional: Positive for chills, fatigue and fever. Negative for diaphoresis.  HENT: Positive for congestion and rhinorrhea. Negative for sneezing.   Eyes: Negative.   Respiratory: Positive for cough. Negative for chest tightness and shortness of breath.   Cardiovascular: Negative for chest pain and leg swelling.   Gastrointestinal: Negative for abdominal pain, blood in stool, diarrhea, nausea and vomiting.  Genitourinary: Negative for difficulty urinating, flank pain, frequency and hematuria.  Musculoskeletal: Positive for myalgias. Negative for arthralgias and back pain.  Skin: Negative for rash.  Neurological: Negative for dizziness, speech difficulty, weakness, numbness and headaches.    Physical Exam Updated Vital Signs BP 93/61 (BP Location: Left Arm)   Pulse 72   Temp 98.3 F (36.8 C) (Oral)   Resp 20   Ht 6' (1.829 m)   Wt 88.5 kg   SpO2 96%   BMI 26.45 kg/m   Physical Exam Constitutional:      Appearance: He is well-developed.  HENT:     Head: Normocephalic and atraumatic.  Eyes:     Pupils: Pupils are equal, round, and reactive to light.  Cardiovascular:     Rate and Rhythm: Normal rate and regular rhythm.     Heart sounds: Normal heart sounds.  Pulmonary:     Effort: Pulmonary effort is normal. No respiratory distress.     Breath sounds: Normal breath sounds. No wheezing or rales.  Chest:     Chest wall: No tenderness.  Abdominal:     General: Bowel sounds are normal.     Palpations: Abdomen is soft.     Tenderness: There is no abdominal tenderness. There is no guarding or rebound.  Musculoskeletal:        General: No swelling. Normal range of motion.     Cervical back: Normal range of motion and neck supple.  Lymphadenopathy:     Cervical: No cervical adenopathy.  Skin:    General: Skin is warm and dry.     Findings: No rash.  Neurological:     Mental Status: He is alert and oriented to person, place, and time.     ED Results / Procedures / Treatments   Labs (all labs ordered are listed, but only abnormal results are displayed) Labs Reviewed  RESP PANEL BY RT-PCR (FLU A&B, COVID) ARPGX2 - Abnormal; Notable for the following components:      Result Value   SARS Coronavirus 2 by RT PCR POSITIVE (*)    All other components within normal limits  CBC WITH  DIFFERENTIAL/PLATELET - Abnormal; Notable for the following components:   RBC 4.02 (*)    HCT 37.8 (*)    Monocytes Absolute 1.2 (*)    All other components within normal limits  COMPREHENSIVE METABOLIC PANEL - Abnormal; Notable for the following components:   Glucose, Bld 145 (*)    Calcium 8.7 (*)    All other components within normal limits  CBG MONITORING, ED - Abnormal; Notable for the following components:   Glucose-Capillary 149 (*)    All  other components within normal limits  CULTURE, BLOOD (ROUTINE X 2)  CULTURE, BLOOD (ROUTINE X 2)  LACTIC ACID, PLASMA  D-DIMER, QUANTITATIVE (NOT AT [facility])  PROCALCITONIN  LACTATE DEHYDROGENASE  FERRITIN  TRIGLYCERIDES  FIBRINOGEN  C-REACTIVE PROTEIN    EKG EKG Interpretation  Date/Time:  Tuesday July 12 2020 11:56:00 EST Ventricular Rate:  74 PR Interval:    QRS Duration: 111 QT Interval:  384 QTC Calculation: 426 R Axis:   33 Text Interpretation: Sinus rhythm Posterior infarct, old No old tracing to compare Confirmed by Malvin Johns [phone]) on 07/12/2020 1:09:52 PM   Radiology DG Chest Port 1 View  Result Date: 07/12/2020 CLINICAL DATA:  Cough, fever and sore throat. COVID positive. EXAM: PORTABLE CHEST 1 VIEW COMPARISON:  07/08/2017 FINDINGS: The cardiac silhouette, mediastinal and hilar contours are within normal limits and stable. The lungs are clear of an acute process. No definite infiltrates or effusions. No worrisome pulmonary lesions. The bony thorax is intact. IMPRESSION: No acute cardiopulmonary findings. Electronically Signed   By: Marijo Sanes M.D.   On: 07/12/2020 10:12    Procedures Procedures (including critical care time)  Medications Ordered in ED Medications  sodium chloride 0.9 % bolus 500 mL (has no administration in time range)  acetaminophen (TYLENOL) tablet 650 mg (650 mg Oral Given 07/12/20 0912)  sodium chloride 0.9 % bolus 500 mL ( Intravenous Stopped 07/12/20 1154)  sodium chloride 0.9 %  bolus 500 mL (0 mLs Intravenous Stopped 07/12/20 1310)  sodium chloride 0.9 % bolus 500 mL (500 mLs Intravenous New Bag/Given 07/12/20 1347)    ED Course  I have reviewed the triage vital signs and the nursing notes.  Pertinent labs & imaging results that were available during my care of the patient were reviewed by me and considered in my medical decision making (see chart for details).    MDM Rules/Calculators/A&P                         Patient is a 74 year old male who presents with cough and flulike illness.  His Covid test is positive.  He does not report any shortness of breath.  His chest x-ray is clear.  His lactate and D-dimer are normal.  He has no hypoxia.  His blood pressure is on the low side.  Is been persistently in the 17C systolic.  His baseline blood pressure based on chart review appears to be between 944 967 systolic.  This was from prior office visits.  He was given 1 L of IV fluids.  He did not have any response with this.  I spoke with Dr. Marylyn Ishihara with the hospitalist service regarding possible admission and he recommended trying up to 2 L of IV fluids and if he is not improved with that, we will call back for admission.  He is currently receiving his third 500 cc bolus.  Dr. Ralene Bathe to take over care and reassess following this. Final Clinical Impression(s) / ED Diagnoses Final diagnoses:  COVID-19 virus infection    Rx / DC Orders ED Discharge Orders    None       Malvin Johns, MD 07/12/20 1505

## 2020-07-13 ENCOUNTER — Encounter: Payer: Self-pay | Admitting: Unknown Physician Specialty

## 2020-07-13 ENCOUNTER — Other Ambulatory Visit: Payer: Self-pay | Admitting: Unknown Physician Specialty

## 2020-07-13 ENCOUNTER — Telehealth: Payer: Self-pay | Admitting: Unknown Physician Specialty

## 2020-07-13 DIAGNOSIS — E1169 Type 2 diabetes mellitus with other specified complication: Secondary | ICD-10-CM

## 2020-07-13 DIAGNOSIS — U071 COVID-19: Secondary | ICD-10-CM

## 2020-07-13 NOTE — Telephone Encounter (Signed)
I connected by phone with Billy Armstrong on 07/13/2020 at 5:01 PM to discuss the potential use of a new treatment for mild to moderate COVID-19 viral infection in non-hospitalized patients.  This patient is a 74 y.o. male that meets the FDA criteria for Emergency Use Authorization of COVID monoclonal antibody casirivimab/imdevimab, bamlanivimab/eteseviamb, or sotrovimab.  Has a (+) direct SARS-CoV-2 viral test result  Has mild or moderate COVID-19   Is NOT hospitalized due to COVID-19  Is within 10 days of symptom onset  Has at least one of the high risk factor(s) for progression to severe COVID-19 and/or hospitalization as defined in EUA.  Specific high risk criteria : BMI > 25 and Diabetes   I have spoken and communicated the following to the patient or parent/caregiver regarding COVID monoclonal antibody treatment:  1. FDA has authorized the emergency use for the treatment of mild to moderate COVID-19 in adults and pediatric patients with positive results of direct SARS-CoV-2 viral testing who are 104 years of age and older weighing at least 40 kg, and who are at high risk for progressing to severe COVID-19 and/or hospitalization.  2. The significant known and potential risks and benefits of COVID monoclonal antibody, and the extent to which such potential risks and benefits are unknown.  3. Information on available alternative treatments and the risks and benefits of those alternatives, including clinical trials.  4. Patients treated with COVID monoclonal antibody should continue to self-isolate and use infection control measures (e.g., wear mask, isolate, social distance, avoid sharing personal items, clean and disinfect "high touch" surfaces, and frequent handwashing) according to CDC guidelines.   5. The patient or parent/caregiver has the option to accept or refuse COVID monoclonal antibody treatment.  After reviewing this information with the patient, the patient has agreed to  receive one of the available covid 19 monoclonal antibodies and will be provided an appropriate fact sheet prior to infusion. Kathrine Haddock, NP 07/13/2020 5:01 PM  Sx onset 12/4

## 2020-07-15 ENCOUNTER — Ambulatory Visit (HOSPITAL_COMMUNITY)
Admission: RE | Admit: 2020-07-15 | Discharge: 2020-07-15 | Disposition: A | Discharge status: Home / Self Care | Payer: BC Managed Care – PPO | Source: Ambulatory Visit | Attending: Pulmonary Disease | Admitting: Pulmonary Disease

## 2020-07-15 DIAGNOSIS — U071 COVID-19: Secondary | ICD-10-CM

## 2020-07-15 DIAGNOSIS — E1169 Type 2 diabetes mellitus with other specified complication: Secondary | ICD-10-CM | POA: Diagnosis not present

## 2020-07-15 MED ORDER — SODIUM CHLORIDE 0.9 % IV SOLN: INTRAVENOUS | Status: DC | PRN

## 2020-07-15 MED ORDER — DIPHENHYDRAMINE HCL 50 MG/ML IJ SOLN: 50.0000 mg | Freq: Once | INTRAMUSCULAR | Status: DC | PRN

## 2020-07-15 MED ORDER — FAMOTIDINE IN NACL 20-0.9 MG/50ML-% IV SOLN: 20.0000 mg | Freq: Once | INTRAVENOUS | Status: DC | PRN

## 2020-07-15 MED ORDER — SODIUM CHLORIDE 0.9 % IV SOLN
1200.0000 mg | Freq: Once | INTRAVENOUS | Status: AC
  Administered 2020-07-15: 1200 mg via INTRAVENOUS

## 2020-07-15 MED ORDER — ALBUTEROL SULFATE HFA 108 (90 BASE) MCG/ACT IN AERS: 2.0000 | INHALATION_SPRAY | Freq: Once | RESPIRATORY_TRACT | Status: DC | PRN

## 2020-07-15 MED ORDER — METHYLPREDNISOLONE SODIUM SUCC 125 MG IJ SOLR: 125.0000 mg | Freq: Once | INTRAMUSCULAR | Status: DC | PRN

## 2020-07-15 MED ORDER — EPINEPHRINE 0.3 MG/0.3ML IJ SOAJ: 0.3000 mg | Freq: Once | INTRAMUSCULAR | Status: DC | PRN

## 2020-07-15 NOTE — Progress Notes (Signed)
  Diagnosis: COVID-19  Physician: Patrick Wright, MD  Procedure: Covid Infusion Clinic Med: casirivimab\imdevimab infusion - Provided patient with casirivimab\imdevimab fact sheet for patients, parents and caregivers prior to infusion.  Complications: No immediate complications noted.  Discharge: Discharged home   Draken Farrior N Eraina Winnie 07/15/2020  

## 2020-07-15 NOTE — Progress Notes (Signed)
Patient reviewed Fact Sheet for Patients, Parents, and Caregivers for Emergency Use Authorization (EUA) of REGEN-COV for the Treatment of Coronavirus. Patient also reviewed and is agreeable to the estimated cost of treatment. Patient is agreeable to proceed.    

## 2020-07-15 NOTE — Discharge Instructions (Signed)
10 Things You Can Do to Manage Your COVID-19 Symptoms at Home If you have possible or confirmed COVID-19: 1. Stay home from work and school. And stay away from other public places. If you must go out, avoid using any kind of public transportation, ridesharing, or taxis. 2. Monitor your symptoms carefully. If your symptoms get worse, call your healthcare provider immediately. 3. Get rest and stay hydrated. 4. If you have a medical appointment, call the healthcare provider ahead of time and tell them that you have or may have COVID-19. 5. For medical emergencies, call 911 and notify the dispatch personnel that you have or may have COVID-19. 6. Cover your cough and sneezes with a tissue or use the inside of your elbow. 7. Wash your hands often with soap and water for at least 20 seconds or clean your hands with an alcohol-based hand sanitizer that contains at least 60% alcohol. 8. As much as possible, stay in a specific room and away from other people in your home. Also, you should use a separate bathroom, if available. If you need to be around other people in or outside of the home, wear a mask. 9. Avoid sharing personal items with other people in your household, like dishes, towels, and bedding. 10. Clean all surfaces that are touched often, like counters, tabletops, and doorknobs. Use household cleaning sprays or wipes according to the label instructions. cdc.gov/coronavirus 02/04/2019 This information is not intended to replace advice given to you by your health care provider. Make sure you discuss any questions you have with your health care provider. Document Revised: 07/09/2019 Document Reviewed: 07/09/2019 Elsevier Patient Education  2020 Elsevier Inc. What types of side effects do monoclonal antibody drugs cause?  Common side effects  In general, the more common side effects caused by monoclonal antibody drugs include: . Allergic reactions, such as hives or itching . Flu-like signs and  symptoms, including chills, fatigue, fever, and muscle aches and pains . Nausea, vomiting . Diarrhea . Skin rashes . Low blood pressure   The CDC is recommending patients who receive monoclonal antibody treatments wait at least 90 days before being vaccinated.  Currently, there are no data on the safety and efficacy of mRNA COVID-19 vaccines in persons who received monoclonal antibodies or convalescent plasma as part of COVID-19 treatment. Based on the estimated half-life of such therapies as well as evidence suggesting that reinfection is uncommon in the 90 days after initial infection, vaccination should be deferred for at least 90 days, as a precautionary measure until additional information becomes available, to avoid interference of the antibody treatment with vaccine-induced immune responses. If you have any questions or concerns after the infusion please call the Advanced Practice Provider on call at [phone]. This number is ONLY intended for your use regarding questions or concerns about the infusion post-treatment side-effects.  Please do not provide this number to others for use. For return to work notes please contact your primary care provider.   If someone you know is interested in receiving treatment please have them call the COVID hotline at [phone].   

## 2020-07-17 LAB — CULTURE, BLOOD (ROUTINE X 2)
Culture: NO GROWTH
Culture: NO GROWTH
Special Requests: ADEQUATE

## 2020-07-25 ENCOUNTER — Other Ambulatory Visit: Payer: Self-pay

## 2020-07-25 ENCOUNTER — Ambulatory Visit (INDEPENDENT_AMBULATORY_CARE_PROVIDER_SITE_OTHER): Payer: BC Managed Care – PPO | Admitting: Family Medicine

## 2020-07-25 ENCOUNTER — Encounter: Payer: Self-pay | Admitting: Family Medicine

## 2020-07-25 DIAGNOSIS — U071 COVID-19: Secondary | ICD-10-CM | POA: Diagnosis not present

## 2020-07-25 MED ORDER — AMBULATORY NON FORMULARY MEDICATION
99 refills | Status: DC
Start: 2020-07-25 — End: 2021-12-08

## 2020-07-25 MED ORDER — ONETOUCH VERIO VI STRP
ORAL_STRIP | 12 refills | Status: DC
Start: 2020-07-25 — End: 2021-10-19

## 2020-07-25 MED ORDER — ONETOUCH ULTRASOFT LANCETS MISC: 12 refills | Status: AC

## 2020-07-25 MED ORDER — TRULICITY 3 MG/0.5ML ~~LOC~~ SOAJ: 3.0000 mg | SUBCUTANEOUS | 2 refills | Status: AC

## 2020-07-25 NOTE — Progress Notes (Signed)
Billy Armstrong - 74 y.o. male MRN [id number]  Date of birth: 02-13-1946  Subjective No chief complaint on file.   HPI Billy Armstrong is a 74 y.o. male here today for follow up of COVID.  He was diagnosed with COVID-19 on 07/12/20 at ER visit.  Initial symptoms included cough, runny nose, body aches and fever.  He was not vaccinated.  He did receive monoclonal antibody infusion on 07/15/20 and reports that symptoms began to improve after this.  He has been out of work since his diagnosis.  He feels like he is ready to return at this time.  He still has mild cough but denies chest pain, shortness of breath, continued fevers, fatigue, nausea, vomiting, diarrhea.    ROS:  A comprehensive ROS was completed and negative except as noted per HPI  No Known Allergies  Past Medical History:  Diagnosis Date  . Diabetes mellitus without complication ([place])   . Hyperlipidemia   . Seborrheic dermatitis of scalp 10/24/2018    Past Surgical History:  Procedure Laterality Date  . EYE SURGERY    . KNEE SURGERY    . LEG SURGERY      Social History   Socioeconomic History  . Marital status: Married    Spouse name: Not on file  . Number of children: Not on file  . Years of education: Not on file  . Highest education level: Not on file  Occupational History  . Not on file  Tobacco Use  . Smoking status: Never Smoker  . Smokeless tobacco: Never Used  Vaping Use  . Vaping Use: Never used  Substance and Sexual Activity  . Alcohol use: Yes    Alcohol/week: 0.0 standard drinks  . Drug use: Yes  . Sexual activity: Yes  Other Topics Concern  . Not on file  Social History Narrative  . Not on file   Social Determinants of Health   Financial Resource Strain: Not on file  Food Insecurity: Not on file  Transportation Needs: Not on file  Physical Activity: Not on file  Stress: Not on file  Social Connections: Not on file    Family History  Problem Relation Age of Onset  . Diabetes Mother   .  Cancer Father   . Cancer Sister   . Cancer Brother     Health Maintenance  Topic Date Due  . COVID-19 Vaccine (1) Never done  . OPHTHALMOLOGY EXAM  06/27/2019  . COLONOSCOPY  08/24/2019  . FOOT EXAM  01/26/2020  . INFLUENZA VACCINE  Never done  . HEMOGLOBIN A1C  10/01/2020  . URINE MICROALBUMIN  03/31/2021  . TETANUS/TDAP  02/26/2026  . Hepatitis C Screening  Completed  . PNA vac Low Risk Adult  Completed     ----------------------------------------------------------------------------------------------------------------------------------------------------------------------------------------------------------------- Physical Exam BP 121/73 (BP Location: Left Arm, Patient Position: Sitting, Cuff Size: Normal)   Pulse 80   Temp 97.8 F (36.6 C)   Wt 187 lb 14.4 oz (85.2 kg)   SpO2 100%   BMI 25.48 kg/m   Physical Exam Constitutional:      Appearance: Normal appearance.  Eyes:     General: No scleral icterus. Cardiovascular:     Rate and Rhythm: Normal rate and regular rhythm.  Pulmonary:     Effort: Pulmonary effort is normal.     Breath sounds: Normal breath sounds.  Musculoskeletal:     Cervical back: Neck supple.  Skin:    General: Skin is warm and dry.  Neurological:  General: No focal deficit present.     Mental Status: He is alert.  Psychiatric:        Mood and Affect: Mood normal.        Behavior: Behavior normal.     ------------------------------------------------------------------------------------------------------------------------------------------------------------------------------------------------------------------- Assessment and Plan  COVID-19 He is relatively asymptomatic at this time and recovering well.  He is cleared to return to work, note provided.  He will keep routine f/u scheduled for 09/2020.    Meds ordered this encounter  Medications  . AMBULATORY NON FORMULARY MEDICATION    Sig: Lancets qs 1 daily testing for 90 days.   DM E11.69 Disp 90 day supply For one touch verio meter    Dispense:  1 each    Refill:  99  . glucose blood (ONETOUCH VERIO) test strip    Sig: For one touch verio meter. Tesr daily    Dispense:  100 each    Refill:  12  . Dulaglutide (TRULICITY) 3 XJ/1.5ZM SOPN    Sig: Inject 3 mg into the skin once a week.    Dispense:  2 mL    Refill:  2  . Lancets (ONETOUCH ULTRASOFT) lancets    Sig: Use as instructed.  Please dispense for onetouch verio meter.    Dispense:  100 each    Refill:  12    No follow-ups on file.    This visit occurred during the SARS-CoV-2 public health emergency.  Safety protocols were in place, including screening questions prior to the visit, additional usage of staff PPE, and extensive cleaning of exam room while observing appropriate contact time as indicated for disinfecting solutions.

## 2020-07-25 NOTE — Assessment & Plan Note (Signed)
He is relatively asymptomatic at this time and recovering well.  He is cleared to return to work, note provided.  He will keep routine f/u scheduled for 09/2020.

## 2020-09-30 ENCOUNTER — Ambulatory Visit: Payer: BC Managed Care – PPO | Admitting: Family Medicine

## 2020-10-07 ENCOUNTER — Other Ambulatory Visit: Payer: Self-pay

## 2020-10-07 ENCOUNTER — Encounter: Payer: Self-pay | Admitting: Family Medicine

## 2020-10-07 ENCOUNTER — Ambulatory Visit (INDEPENDENT_AMBULATORY_CARE_PROVIDER_SITE_OTHER): Payer: 59 | Admitting: Family Medicine

## 2020-10-07 VITALS — BP 113/60 | HR 73 | Temp 97.7°F | Wt 189.7 lb

## 2020-10-07 DIAGNOSIS — E1169 Type 2 diabetes mellitus with other specified complication: Secondary | ICD-10-CM

## 2020-10-07 DIAGNOSIS — E785 Hyperlipidemia, unspecified: Secondary | ICD-10-CM

## 2020-10-07 LAB — POCT GLYCOSYLATED HEMOGLOBIN (HGB A1C): Hemoglobin A1C: 8.8 % — AB (ref 4.0–5.6)

## 2020-10-07 MED ORDER — SYNJARDY XR 25-1000 MG PO TB24
1.0000 | ORAL_TABLET | Freq: Every day | ORAL | 2 refills | Status: DC
Start: 2020-10-07 — End: 2021-10-05

## 2020-10-07 NOTE — Assessment & Plan Note (Signed)
Lab Results  Component Value Date   LDLCALC 42 03/31/2020  Tolerating atorvastatin well, continue.

## 2020-10-07 NOTE — Progress Notes (Signed)
Billy Armstrong - 75 y.o. male MRN [id number]  Date of birth: 13-Apr-1946  Subjective Chief Complaint  Patient presents with  . Follow-up    HPI Billy Armstrong is a 75 y.o. male with history of T2DM and HLD here today for follow up visit.   DM is treated with combination of trulicity and Synjardy.  He is doing well with current medications.  Fasting blood sugars are typically around 110-130.  Denies any other symptoms related to diabetes.  He admits he could do a little better with diet and will work on this.   ROS:  A comprehensive ROS was completed and negative except as noted per HPI  No Known Allergies  Past Medical History:  Diagnosis Date  . Diabetes mellitus without complication ([place])   . Hyperlipidemia   . Seborrheic dermatitis of scalp 10/24/2018    Past Surgical History:  Procedure Laterality Date  . EYE SURGERY    . KNEE SURGERY    . LEG SURGERY      Social History   Socioeconomic History  . Marital status: Married    Spouse name: Not on file  . Number of children: Not on file  . Years of education: Not on file  . Highest education level: Not on file  Occupational History  . Not on file  Tobacco Use  . Smoking status: Never Smoker  . Smokeless tobacco: Never Used  Vaping Use  . Vaping Use: Never used  Substance and Sexual Activity  . Alcohol use: Yes    Alcohol/week: 0.0 standard drinks  . Drug use: Yes  . Sexual activity: Yes  Other Topics Concern  . Not on file  Social History Narrative  . Not on file   Social Determinants of Health   Financial Resource Strain: Not on file  Food Insecurity: Not on file  Transportation Needs: Not on file  Physical Activity: Not on file  Stress: Not on file  Social Connections: Not on file    Family History  Problem Relation Age of Onset  . Diabetes Mother   . Cancer Father   . Cancer Sister   . Cancer Brother     Health Maintenance  Topic Date Due  . OPHTHALMOLOGY EXAM  06/27/2019  . COLONOSCOPY (Pts  45-11yrs Insurance coverage will need to be confirmed)  08/24/2019  . FOOT EXAM  01/26/2020  . COVID-19 Vaccine (1) 10/23/2020 (Originally 08/13/1957)  . INFLUENZA VACCINE  11/03/2020 (Originally 03/06/2020)  . URINE MICROALBUMIN  03/31/2021  . HEMOGLOBIN A1C  04/09/2021  . TETANUS/TDAP  02/26/2026  . Hepatitis C Screening  Completed  . PNA vac Low Risk Adult  Completed  . HPV VACCINES  Aged Out     ----------------------------------------------------------------------------------------------------------------------------------------------------------------------------------------------------------------- Physical Exam BP 113/60 (BP Location: Left Arm, Patient Position: Sitting, Cuff Size: Large)   Pulse 73   Temp 97.7 F (36.5 C)   Wt 189 lb 11.2 oz (86 kg)   SpO2 99%   BMI 25.73 kg/m   Physical Exam Constitutional:      Appearance: Normal appearance.  HENT:     Head: Normocephalic and atraumatic.  Eyes:     General: No scleral icterus. Cardiovascular:     Rate and Rhythm: Normal rate and regular rhythm.  Pulmonary:     Effort: Pulmonary effort is normal.     Breath sounds: Normal breath sounds.  Musculoskeletal:     Cervical back: Neck supple.  Neurological:     General: No focal deficit present.  Mental Status: He is alert.  Psychiatric:        Mood and Affect: Mood normal.        Behavior: Behavior normal.     ------------------------------------------------------------------------------------------------------------------------------------------------------------------------------------------------------------------- Assessment and Plan  Diabetes mellitus ([place]) A1c is slightly improved from last time Encouraged to work on dietary change F/u in 6 months.   Hyperlipidemia associated with type 2 diabetes mellitus [facility]) Lab Results  Component Value Date   LDLCALC 42 03/31/2020  Tolerating atorvastatin well, continue.    Meds ordered this encounter   Medications  . Empagliflozin-metFORMIN HCl ER (SYNJARDY XR) 25-1000 MG TB24    Sig: Take 1 tablet by mouth daily.    Dispense:  90 tablet    Refill:  2    Return in about 6 months (around 04/09/2021) for T2DM.    This visit occurred during the SARS-CoV-2 public health emergency.  Safety protocols were in place, including screening questions prior to the visit, additional usage of staff PPE, and extensive cleaning of exam room while observing appropriate contact time as indicated for disinfecting solutions.

## 2020-10-07 NOTE — Assessment & Plan Note (Signed)
A1c is slightly improved from last time Encouraged to work on dietary change F/u in 6 months.

## 2020-10-07 NOTE — Patient Instructions (Signed)

## 2020-10-12 ENCOUNTER — Encounter: Payer: Self-pay | Admitting: Family Medicine

## 2021-02-26 ENCOUNTER — Other Ambulatory Visit: Payer: Self-pay | Admitting: Osteopathic Medicine

## 2021-03-06 ENCOUNTER — Other Ambulatory Visit: Payer: Self-pay | Admitting: Family Medicine

## 2021-04-03 ENCOUNTER — Encounter (HOSPITAL_BASED_OUTPATIENT_CLINIC_OR_DEPARTMENT_OTHER): Payer: Self-pay | Admitting: *Deleted

## 2021-04-03 ENCOUNTER — Emergency Department (HOSPITAL_BASED_OUTPATIENT_CLINIC_OR_DEPARTMENT_OTHER): Payer: 59

## 2021-04-03 ENCOUNTER — Other Ambulatory Visit: Payer: Self-pay

## 2021-04-03 ENCOUNTER — Emergency Department (HOSPITAL_BASED_OUTPATIENT_CLINIC_OR_DEPARTMENT_OTHER)
Admission: EM | Admit: 2021-04-03 | Discharge: 2021-04-03 | Disposition: A | Discharge status: Home / Self Care | Payer: 59 | Attending: Emergency Medicine | Admitting: Emergency Medicine

## 2021-04-03 DIAGNOSIS — Z8616 Personal history of COVID-19: Secondary | ICD-10-CM | POA: Diagnosis not present

## 2021-04-03 DIAGNOSIS — R112 Nausea with vomiting, unspecified: Secondary | ICD-10-CM | POA: Diagnosis not present

## 2021-04-03 DIAGNOSIS — K5900 Constipation, unspecified: Secondary | ICD-10-CM | POA: Insufficient documentation

## 2021-04-03 DIAGNOSIS — Z7982 Long term (current) use of aspirin: Secondary | ICD-10-CM | POA: Insufficient documentation

## 2021-04-03 DIAGNOSIS — Z794 Long term (current) use of insulin: Secondary | ICD-10-CM | POA: Diagnosis not present

## 2021-04-03 DIAGNOSIS — I10 Essential (primary) hypertension: Secondary | ICD-10-CM | POA: Diagnosis not present

## 2021-04-03 DIAGNOSIS — E119 Type 2 diabetes mellitus without complications: Secondary | ICD-10-CM | POA: Diagnosis not present

## 2021-04-03 DIAGNOSIS — Z7984 Long term (current) use of oral hypoglycemic drugs: Secondary | ICD-10-CM | POA: Insufficient documentation

## 2021-04-03 DIAGNOSIS — R109 Unspecified abdominal pain: Secondary | ICD-10-CM | POA: Diagnosis present

## 2021-04-03 LAB — COMPREHENSIVE METABOLIC PANEL
ALT: 24 U/L (ref 0–44)
AST: 21 U/L (ref 15–41)
Albumin: 4.5 g/dL (ref 3.5–5.0)
Alkaline Phosphatase: 79 U/L (ref 38–126)
Anion gap: 8 (ref 5–15)
BUN: 21 mg/dL (ref 8–23)
CO2: 28 mmol/L (ref 22–32)
Calcium: 10.4 mg/dL — ABNORMAL HIGH (ref 8.9–10.3)
Chloride: 102 mmol/L (ref 98–111)
Creatinine, Ser: 0.66 mg/dL (ref 0.61–1.24)
GFR, Estimated: >60 mL/min (ref >60)
Glucose, Bld: 139 mg/dL — ABNORMAL HIGH (ref 70–99)
Potassium: 4.5 mmol/L (ref 3.5–5.1)
Sodium: 138 mmol/L (ref 135–145)
Total Bilirubin: 0.7 mg/dL (ref 0.3–1.2)
Total Protein: 7.4 g/dL (ref 6.5–8.1)

## 2021-04-03 LAB — URINALYSIS, MICROSCOPIC (REFLEX): Squamous Epithelial / HPF: NONE SEEN (ref 0–5)

## 2021-04-03 LAB — URINALYSIS, ROUTINE W REFLEX MICROSCOPIC
Bilirubin Urine: NEGATIVE
Glucose, UA: >=500 mg/dL — AB
Hgb urine dipstick: NEGATIVE
Ketones, ur: 40 mg/dL — AB
Leukocytes,Ua: NEGATIVE
Nitrite: NEGATIVE
Protein, ur: NEGATIVE mg/dL
Specific Gravity, Urine: 1.01 (ref 1.005–1.030)
pH: 5 (ref 5.0–8.0)

## 2021-04-03 LAB — CBC WITH DIFFERENTIAL/PLATELET
Abs Immature Granulocytes: 0.01 10*3/uL (ref 0.00–0.07)
Basophils Absolute: 0.1 10*3/uL (ref 0.0–0.1)
Basophils Relative: 1 %
Eosinophils Absolute: 0.1 10*3/uL (ref 0.0–0.5)
Eosinophils Relative: 1 %
HCT: 43.1 % (ref 39.0–52.0)
Hemoglobin: 14.7 g/dL (ref 13.0–17.0)
Immature Granulocytes: 0 %
Lymphocytes Relative: 22 %
Lymphs Abs: 1.6 10*3/uL (ref 0.7–4.0)
MCH: 32 pg (ref 26.0–34.0)
MCHC: 34.1 g/dL (ref 30.0–36.0)
MCV: 93.9 fL (ref 80.0–100.0)
Monocytes Absolute: 0.8 10*3/uL (ref 0.1–1.0)
Monocytes Relative: 12 %
Neutro Abs: 4.6 10*3/uL (ref 1.7–7.7)
Neutrophils Relative %: 64 %
Platelets: 172 10*3/uL (ref 150–400)
RBC: 4.59 MIL/uL (ref 4.22–5.81)
RDW: 12.5 % (ref 11.5–15.5)
WBC: 7.2 10*3/uL (ref 4.0–10.5)
nRBC: 0 % (ref 0.0–0.2)

## 2021-04-03 LAB — LIPASE, BLOOD: Lipase: 19 U/L (ref 11–51)

## 2021-04-03 MED ORDER — IOHEXOL 300 MG/ML  SOLN: 75.0000 mL | Freq: Once | INTRAMUSCULAR | Status: DC | PRN

## 2021-04-03 MED ORDER — IOHEXOL 350 MG/ML SOLN
85.0000 mL | Freq: Once | INTRAVENOUS | Status: AC | PRN
  Administered 2021-04-03: 85 mL via INTRAVENOUS

## 2021-04-03 NOTE — ED Triage Notes (Signed)
co n/v after  eating x 1 week

## 2021-04-03 NOTE — Discharge Instructions (Addendum)
As we discussed today please eat a bland diet for the next 1 to 2 weeks. As I showed you on your CAT scan you have a large amount of stool in your intestines.  I suspect that this is the primary reason for your symptoms. As we discussed today I would recommend using MiraLAX.  Please start by taking 1 dose on day 1, 2 doses on day 2, 3 doses on day 3 and so forth.  It can take 1 to 3 days for you to notice a change in your bowel movements with MiraLAX.  This works by pulling extra water into your intestines to help soften and move along the stool so it is important that you are making sure you are drinking extra water.  You can take up to 8 doses of MiraLAX in 1 day however this will cause you to have bad crampy abdominal pains.  I would also recommend that you schedule a follow-up appointment with your primary care doctor for repeat evaluation.  If you develop fevers, have any new or concerning symptoms please seek additional medical care and evaluation.

## 2021-04-03 NOTE — ED Provider Notes (Signed)
[facility] Provider Note   CSN: [id number] Arrival date & time: 04/03/21  1610     History Chief Complaint  Patient presents with   Vomiting    Billy Armstrong is a 75 y.o. male with past medical history of diabetes, hypertension, who presents today for evaluation of 1 to 2 weeks of nausea and vomiting.  He states that on average every other day he is having an episode of vomiting.  This is after eating.  He states that it starts is mild, the nausea worsens until he ends up vomiting.  He denies any fevers.  He does not have significant abdominal pain with this however over the past few days has developed mild lower abdominal pain.  No diarrhea, last bowel movement was yesterday and was slightly firm but otherwise normal for him.  He denies dysuria, frequency or urgency.  No chest pain, cough, or shortness of breath.  He does not have any history of similar.  No new medications prior to the onset of the symptoms.  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   [place] cardiac risk >20% in next 10 years 12/27/2015   Vitamin D deficiency 12/26/2015    Past Surgical History:  Procedure Laterality Date   EYE SURGERY     KNEE SURGERY     LEG SURGERY         Family History  Problem Relation Age of Onset   Diabetes Mother    Cancer Father    Cancer Sister    Cancer Brother     Social History   Tobacco Use    Smoking status: Never   Smokeless tobacco: Never  Vaping Use   Vaping Use: Never used  Substance Use Topics   Alcohol use: Yes    Alcohol/week: 0.0 standard drinks   Drug use: Yes    Home Medications Prior to Admission medications   Medication Sig Start Date End Date Taking? Authorizing Provider  AMBULATORY NON FORMULARY MEDICATION Glucometer of choice with test strips. Dispense QS x3 months. Test daily.  Controlled DM E11.69 07/25/18   Gregor Hams, MD  AMBULATORY NON FORMULARY MEDICATION Lancets qs 1 daily testing for 90 days.  DM E11.69 Disp 90 day supply For one touch verio meter 07/25/20   Luetta Nutting, DO  aspirin 81 MG tablet Take 81 mg by mouth daily.    [provider]  atorvastatin (LIPITOR) 40 MG tablet TAKE 1 TABLET BY MOUTH EVERY DAY 02/27/21   Luetta Nutting, DO  azelastine (ASTELIN) 0.1 % nasal spray PLACE 2 SPRAYS INTO BOTH NOSTRILS 2 (TWO) TIMES DAILY. USE IN [facility] NOSTRIL AS DIRECTED 07/17/18   Gregor Hams, MD  Calcium Citrate-Vitamin D (CALCIUM + D PO) Take by mouth.    [provider]  Cholecalciferol (D3 ADULT PO) Take by mouth.    [provider]  Dulaglutide (TRULICITY) 1.5 0000000 SOPN INJECT 0.5 MLS (1.5 MG TOTAL) INTO  THE SKIN ONCE A WEEK. 03/07/21 06/05/21  Luetta Nutting, DO  Empagliflozin-metFORMIN HCl ER (SYNJARDY XR) 25-1000 MG TB24 Take 1 tablet by mouth daily. 10/07/20   Luetta Nutting, DO  glucose blood (ONETOUCH VERIO) test strip For one touch verio meter. Tesr daily 07/25/20   Luetta Nutting, DO  ibuprofen (ADVIL,MOTRIN) 200 MG tablet Take 200 mg by mouth every 6 (six) hours as needed.    [provider]  Lancets [facility] ULTRASOFT) lancets Use as instructed.  Please dispense for onetouch verio meter. 07/25/20   Luetta Nutting, DO  Multiple Vitamins-Minerals (MULTIVITAMIN PO) Take by mouth.    [provider]  Omega-3 Fatty Acids (FISH OIL PO) Take by mouth.    [provider]    Allergies     Patient has no known allergies.  Review of Systems   Review of Systems  Constitutional:  Negative for activity change and fever.  Respiratory:  Negative for cough, chest tightness and shortness of breath.   Cardiovascular:  Negative for chest pain and leg swelling.  Gastrointestinal:  Positive for abdominal pain, nausea and vomiting. Negative for diarrhea and rectal pain.  Genitourinary:  Negative for dysuria and testicular pain.  Neurological:  Negative for weakness and headaches.   Physical Exam Updated Vital Signs BP 104/66 (BP Location: Right Arm)   Pulse 69   Temp 98.3 F (36.8 C) (Oral)   Resp (!) 9   Ht 6' (1.829 m)   Wt 81.6 kg   SpO2 98%   BMI 24.41 kg/m   Physical Exam Vitals and nursing note reviewed.  Constitutional:      General: He is not in acute distress.    Appearance: He is not ill-appearing.  HENT:     Head: Normocephalic and atraumatic.  Eyes:     Conjunctiva/sclera: Conjunctivae normal.  Cardiovascular:     Rate and Rhythm: Normal rate and regular rhythm.     Heart sounds: Normal heart sounds.  Pulmonary:     Effort: Pulmonary effort is normal. No respiratory distress.     Breath sounds: Normal breath sounds.  Abdominal:     General: There is no distension.     Tenderness: There is abdominal tenderness (RLQ >LLQ). There is no guarding.  Musculoskeletal:     Cervical back: Normal range of motion and neck supple.     Right lower leg: No edema.     Left lower leg: No edema.     Comments: No obvious acute injury  Skin:    General: Skin is warm and dry.     Comments: Mild erythema around the umbilicus.   Neurological:     Mental Status: He is alert.     Comments: Awake and alert, answers all questions appropriately.  Speech is not slurred.  Psychiatric:        Mood and Affect: Mood normal.        Behavior: Behavior normal.    ED Results / Procedures / Treatments   Labs (all labs ordered are listed, but only abnormal results are  displayed) Labs Reviewed  COMPREHENSIVE METABOLIC PANEL - Abnormal; Notable for the following components:      Result Value   Glucose, Bld 139 (*)    Calcium 10.4 (*)    All other components within normal limits  URINALYSIS, ROUTINE W REFLEX MICROSCOPIC - Abnormal; Notable for the following components:   Glucose, UA >=500 (*)    Ketones, ur 40 (*)    All other components within normal  limits  URINALYSIS, MICROSCOPIC (REFLEX) - Abnormal; Notable for the following components:   Bacteria, UA RARE (*)    All other components within normal limits  LIPASE, BLOOD  CBC WITH DIFFERENTIAL/PLATELET    EKG EKG Interpretation  Date/Time:  Monday April 03 2021 18:23:32 EDT Ventricular Rate:  62 PR Interval:  131 QRS Duration: 104 QT Interval:  411 QTC Calculation: 418 R Axis:   47 Text Interpretation: Sinus rhythm Low voltage, precordial leads Abnormal R-wave progression, early transition NSR, similar to previous Confirmed by Lavenia Atlas [phone]) on 04/03/2021 7:16:27 PM  Radiology CT Abdomen Pelvis W Contrast  Result Date: 04/03/2021 CLINICAL DATA:  Nausea vomiting lower abdominal EXAM: CT ABDOMEN AND PELVIS WITH CONTRAST TECHNIQUE: Multidetector CT imaging of the abdomen and pelvis was performed using the standard protocol following bolus administration of intravenous contrast. CONTRAST:  23m OMNIPAQUE IOHEXOL 350 MG/ML SOLN COMPARISON:  None. FINDINGS: Lower chest: No acute abnormality. Hepatobiliary: No focal liver abnormality is seen. No gallstones, gallbladder wall thickening, or biliary dilatation. Pancreas: Atrophic.  No inflammatory change Spleen: Normal in size without focal abnormality. Adrenals/Urinary Tract: Adrenal glands are unremarkable. Kidneys are normal, without renal calculi, focal lesion, or hydronephrosis. Bladder is unremarkable. Stomach/Bowel: Stomach is within normal limits. Appendix not well seen but no right lower quadrant inflammatory process. No evidence of bowel  wall thickening, distention, or inflammatory changes. Vascular/Lymphatic: Mild aortic atherosclerosis. No aneurysm. No suspicious nodes Reproductive: Prostate is unremarkable. Other: Negative for pelvic effusion or free air Musculoskeletal: No acute or significant osseous findings. Degenerative changes at multiple levels. IMPRESSION: 1. No CT evidence for acute intra-abdominal or pelvic abnormality. 2. Increased stool burden suggesting constipation. Electronically Signed   By: KDonavan FoilM.D.   On: 04/03/2021 19:47    Procedures Procedures   Medications Ordered in ED Medications  iohexol (OMNIPAQUE) 350 MG/ML injection 85 mL (85 mLs Intravenous Contrast Given 04/03/21 1927)    ED Course  I have reviewed the triage vital signs and the nursing notes.  Pertinent labs & imaging results that were available during my care of the patient were reviewed by me and considered in my medical decision making (see chart for details).    MDM Rules/Calculators/A&P                          Patient is a 75year old man who presents today for evaluation of 1 to 2 weeks of intermittent nausea and vomiting that accompanies eating. Labs are obtained and reviewed, CBC is without anemia or leukocytosis.  CMP is without significant acute electrolyte derangements.  Lipase is not elevated.  He is not having any chest pain or shortness of breath, however EKG is obtained without ischemia or significant arrhythmia.  UA is without evidence of infection.  CT scan abdomen pelvis is obtained without any acute intra-abdominal or pelvic abnormalities, does note a increased stool burden.  This is consistent with constipation and his reports that he has been having less frequent and smaller bowel movements recently. Recommended MiraLAX, increasing p.o. fluids.  Additionally recommended PCP follow-up.  He tolerated PO in the department with out difficulties.   Return precautions were discussed with patient who states their  understanding.  At the time of discharge patient denied any unaddressed complaints or concerns.  Patient is agreeable for discharge home.  Note: Portions of this report may have been transcribed using voice recognition software. Every effort was made to ensure accuracy; however, inadvertent computerized transcription errors may  be present  This patient was seen as a shared visit with Dr. Dina Rich.   Final Clinical Impression(s) / ED Diagnoses Final diagnoses:  Non-intractable vomiting with nausea, unspecified vomiting type  Constipation, unspecified constipation type    Rx / DC Orders ED Discharge Orders     None        Lorin Glass, PA-C 04/03/21 2329    Lorelle Gibbs, DO 04/05/21 2016

## 2021-04-05 ENCOUNTER — Telehealth: Payer: Self-pay | Admitting: General Practice

## 2021-04-05 NOTE — Telephone Encounter (Signed)
Transition Care Management Follow-up Telephone Call Date of discharge and from where: 04/03/21 from [facility] ([facility]) How have you been since you were released from the hospital? Still nauseous but trying to drink fluids. He has been doing metamucil. Any questions or concerns? No  Items Reviewed: Did the pt receive and understand the discharge instructions provided? Yes  Medications obtained and verified? Yes  Other? No  Any new allergies since your discharge? No  Dietary orders reviewed? Yes Do you have support at home? Yes   Home Care and Equipment/Supplies: Were home health services ordered? no   Functional Questionnaire: (I = Independent and D = Dependent) ADLs: I  Bathing/Dressing- I  Meal Prep- I  Eating- I  Maintaining continence- I  Transferring/Ambulation- I  Managing Meds- I  Follow up appointments reviewed:  PCP Hospital f/u appt confirmed? Yes  Scheduled to see Dr. Zigmund Daniel on 04/06/21 @ 1550. [facility] f/u appt confirmed? No  Are transportation arrangements needed? No  If their condition worsens, is the pt aware to call PCP or go to the Emergency Dept.? Yes Was the patient provided with contact information for the PCP's office or ED? Yes Was to pt encouraged to call back with questions or concerns? Yes

## 2021-04-06 ENCOUNTER — Other Ambulatory Visit: Payer: Self-pay

## 2021-04-06 ENCOUNTER — Ambulatory Visit (INDEPENDENT_AMBULATORY_CARE_PROVIDER_SITE_OTHER): Payer: 59 | Admitting: Family Medicine

## 2021-04-06 ENCOUNTER — Encounter: Payer: Self-pay | Admitting: Family Medicine

## 2021-04-06 DIAGNOSIS — F4329 Adjustment disorder with other symptoms: Secondary | ICD-10-CM

## 2021-04-06 DIAGNOSIS — E1169 Type 2 diabetes mellitus with other specified complication: Secondary | ICD-10-CM | POA: Diagnosis not present

## 2021-04-06 DIAGNOSIS — K5900 Constipation, unspecified: Secondary | ICD-10-CM | POA: Diagnosis not present

## 2021-04-06 DIAGNOSIS — F4381 Prolonged grief disorder: Secondary | ICD-10-CM

## 2021-04-06 MED ORDER — TRULICITY 1.5 MG/0.5ML ~~LOC~~ SOAJ: 1.5000 mg | SUBCUTANEOUS | 2 refills | Status: DC

## 2021-04-06 MED ORDER — BUPROPION HCL ER (XL) 150 MG PO TB24: 150.0000 mg | ORAL_TABLET | Freq: Every day | ORAL | 1 refills | Status: DC

## 2021-04-06 NOTE — Patient Instructions (Signed)
Take 1 bottle of magnesium citrate with senokot-s  If magnesium citrate is not available I would recommend using a fleets enema with senokot-s.  Once bowels are start moving start linzess '145mg'$  daily  Start bupropion for depressive symptoms.

## 2021-04-10 DIAGNOSIS — K5900 Constipation, unspecified: Secondary | ICD-10-CM | POA: Insufficient documentation

## 2021-04-10 NOTE — Progress Notes (Signed)
Billy Armstrong - 75 y.o. male MRN [id number]  Date of birth: [birth year]  Subjective Chief Complaint  Patient presents with   Constipation   Nausea   Emesis    HPI Billy Armstrong is a 75 year old male here today with complaint of constipation and decreased appetite.  Billy Armstrong was recently seen in the emergency department for vomiting.  CT scan completed showing increased stool burden.  Billy Armstrong has had difficulty with bowel movements over the past several weeks.  Billy Armstrong has been using MiraLAX without very good results.  Billy Armstrong has had a small bowel movement recently with pebble-like stool.  Billy Armstrong continues to have some mild nausea with decreased appetite.  Billy Armstrong denies abdominal pain.  ROS:  A comprehensive ROS was completed and negative except as noted per HPI  No Known Allergies  Past Medical History:  Diagnosis Date   Diabetes mellitus without complication ([place])    Hyperlipidemia    Seborrheic dermatitis of scalp 10/24/2018    Past Surgical History:  Procedure Laterality Date   EYE SURGERY     KNEE SURGERY     LEG SURGERY      Social History   Socioeconomic History   Marital status: Married    Spouse name: Not on file   Number of children: Not on file   Years of education: Not on file   Highest education level: Not on file  Occupational History   Not on file  Tobacco Use   Smoking status: Never   Smokeless tobacco: Never  Vaping Use   Vaping Use: Never used  Substance and Sexual Activity   Alcohol use: Yes    Alcohol/week: 0.0 standard drinks   Drug use: Yes   Sexual activity: Yes  Other Topics Concern   Not on file  Social History Narrative   Not on file   Social Determinants of Health   Financial Resource Strain: Not on file  Food Insecurity: Not on file  Transportation Needs: Not on file  Physical Activity: Not on file  Stress: Not on file  Social Connections: Not on file    Family History  Problem Relation Age of Onset   Diabetes Mother    Cancer Father    Cancer Sister    Cancer  Brother     Health Maintenance  Topic Date Due   COVID-19 Vaccine (1) Never done   Zoster Vaccines- Shingrix (1 of 2) Never done   COLONOSCOPY (Pts 45-63yr Insurance coverage will need to be confirmed)  08/24/2019   FOOT EXAM  01/26/2020   INFLUENZA VACCINE  Never done   URINE MICROALBUMIN  03/31/2021   HEMOGLOBIN A1C  04/09/2021   OPHTHALMOLOGY EXAM  07/08/2021   TETANUS/TDAP  02/26/2026   Hepatitis C Screening  Completed   PNA vac Low Risk Adult  Completed   HPV VACCINES  Aged Out     ----------------------------------------------------------------------------------------------------------------------------------------------------------------------------------------------------------------- Physical Exam BP 107/65 (BP Location: Left Arm, Patient Position: Sitting, Cuff Size: Normal)   Pulse 72   Temp 98 F (36.7 C)   Ht 6' (1.829 m)   Wt 186 lb 6.4 oz (84.6 kg)   SpO2 99%   BMI 25.28 kg/m   Physical Exam Constitutional:      Appearance: Normal appearance.  Eyes:     General: No scleral icterus. Cardiovascular:     Rate and Rhythm: Normal rate and regular rhythm.  Pulmonary:     Effort: Pulmonary effort is normal.     Breath sounds: Normal breath sounds.  Abdominal:  General: Abdomen is flat. There is no distension.     Palpations: Abdomen is soft.     Tenderness: There is no abdominal tenderness.  Musculoskeletal:     Cervical back: Neck supple.  Neurological:     General: No focal deficit present.     Mental Status: Billy Armstrong is alert.  Psychiatric:        Mood and Affect: Mood normal.        Behavior: Behavior normal.    ------------------------------------------------------------------------------------------------------------------------------------------------------------------------------------------------------------------- Assessment and Plan  Diabetes mellitus ([place]) Currently on Trulicity at 1.5 mg weekly.  Billy Armstrong questions whether Billy Armstrong can increase to  the 3 mg dose however with his current constipation I recommend that we leave it at this dose for now.  Constipation Recommend adding magnesium citrate with Senokot-S tablets.  If magnesium citrate is not available I recommended that Billy Armstrong try fleets enema.  Once bowels are starting to move Billy Armstrong can start [place] which I provided samples for him today.  Red flags reviewed.  Return in about 2 weeks (around 04/20/2021).   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  Adding bupropion 150 mg daily.   Meds ordered this encounter  Medications   Dulaglutide (TRULICITY) 1.5 0000000 SOPN    Sig: Inject 1.5 mg into the skin once a week.    Dispense:  2 mL    Refill:  2   buPROPion (WELLBUTRIN XL) 150 MG 24 hr tablet    Sig: Take 1 tablet (150 mg total) by mouth daily.    Dispense:  90 tablet    Refill:  1    Return in about 2 weeks (around 04/20/2021).    This visit occurred during the SARS-CoV-2 public health emergency.  Safety protocols were in place, including screening questions prior to the visit, additional usage of staff PPE, and extensive cleaning of exam room while observing appropriate contact time as indicated for disinfecting solutions.

## 2021-04-10 NOTE — Assessment & Plan Note (Signed)
Recommend adding magnesium citrate with Senokot-S tablets.  If magnesium citrate is not available I recommended that he try fleets enema.  Once bowels are starting to move he can start [place] which I provided samples for him today.  Red flags reviewed.  Return in about 2 weeks (around 04/20/2021).

## 2021-04-10 NOTE — Assessment & Plan Note (Signed)
Continues to have depressive symptoms.  Adding bupropion 150 mg daily.

## 2021-04-10 NOTE — Assessment & Plan Note (Signed)
Currently on Trulicity at 1.5 mg weekly.  He questions whether he can increase to the 3 mg dose however with his current constipation I recommend that we leave it at this dose for now.

## 2021-04-14 ENCOUNTER — Ambulatory Visit: Payer: 59 | Admitting: Family Medicine

## 2021-04-19 ENCOUNTER — Encounter: Payer: Self-pay | Admitting: Family Medicine

## 2021-04-19 ENCOUNTER — Other Ambulatory Visit: Payer: Self-pay

## 2021-04-19 ENCOUNTER — Ambulatory Visit (INDEPENDENT_AMBULATORY_CARE_PROVIDER_SITE_OTHER): Payer: 59 | Admitting: Family Medicine

## 2021-04-19 VITALS — BP 117/73 | HR 80 | Temp 98.6°F | Wt 181.1 lb

## 2021-04-19 DIAGNOSIS — K5900 Constipation, unspecified: Secondary | ICD-10-CM | POA: Diagnosis not present

## 2021-04-19 DIAGNOSIS — R110 Nausea: Secondary | ICD-10-CM

## 2021-04-19 DIAGNOSIS — F4329 Adjustment disorder with other symptoms: Secondary | ICD-10-CM

## 2021-04-19 DIAGNOSIS — F4381 Prolonged grief disorder: Secondary | ICD-10-CM

## 2021-04-19 MED ORDER — PANTOPRAZOLE SODIUM 40 MG PO TBEC
40.0000 mg | DELAYED_RELEASE_TABLET | Freq: Every day | ORAL | 3 refills | Status: DC
Start: 2021-04-19 — End: 2021-05-11

## 2021-04-19 MED ORDER — ONDANSETRON 4 MG PO TBDP: 4.0000 mg | ORAL_TABLET | Freq: Three times a day (TID) | ORAL | 3 refills | Status: DC | PRN

## 2021-04-23 ENCOUNTER — Encounter: Payer: Self-pay | Admitting: Family Medicine

## 2021-04-23 DIAGNOSIS — R110 Nausea: Secondary | ICD-10-CM | POA: Insufficient documentation

## 2021-04-23 NOTE — Assessment & Plan Note (Signed)
He is doing well bupropion at this time.  We will continue at current strength.

## 2021-04-23 NOTE — Assessment & Plan Note (Signed)
Pulse constipation has resolved at this point.  He will continue MiraLAX and/or fiber supplement as needed.

## 2021-04-23 NOTE — Assessment & Plan Note (Addendum)
Tajah continues to have nausea which I think may be related to either reflux or possibly gastroparesis.  We will add PPI as well as Zofran as needed.  Referral placed to gastroenterology

## 2021-04-23 NOTE — Progress Notes (Signed)
Billy Armstrong - 75 y.o. male MRN [id number]  Date of birth: January 19, 1946  Subjective No chief complaint on file.   HPI Billy Armstrong is a 75 year old male here today for follow-up visit.  Reports that constipation has improved with prescribed bowel regimen however he continues to have frequent nausea.  He has not noticed any particular foods which make his nausea worse.  He denies abdominal pain.  He does have some mild reflux symptoms.  He denies chest pain or shortness of breath.  He feels that bupropion may be helping some since adding this.  No issues with tolerance.  ROS:  A comprehensive ROS was completed and negative except as noted per HPI  No Known Allergies  Past Medical History:  Diagnosis Date   Diabetes mellitus without complication ([place])    Hyperlipidemia    Seborrheic dermatitis of scalp 10/24/2018    Past Surgical History:  Procedure Laterality Date   EYE SURGERY     KNEE SURGERY     LEG SURGERY      Social History   Socioeconomic History   Marital status: Widowed    Spouse name: Not on file   Number of children: Not on file   Years of education: Not on file   Highest education level: Not on file  Occupational History   Not on file  Tobacco Use   Smoking status: Never   Smokeless tobacco: Never  Vaping Use   Vaping Use: Never used  Substance and Sexual Activity   Alcohol use: Yes    Alcohol/week: 0.0 standard drinks   Drug use: Yes   Sexual activity: Yes  Other Topics Concern   Not on file  Social History Narrative   Not on file   Social Determinants of Health   Financial Resource Strain: Not on file  Food Insecurity: Not on file  Transportation Needs: Not on file  Physical Activity: Not on file  Stress: Not on file  Social Connections: Not on file    Family History  Problem Relation Age of Onset   Diabetes Mother    Cancer Father    Cancer Sister    Cancer Brother     Health Maintenance  Topic Date Due   COLONOSCOPY (Pts 45-18yr Insurance  coverage will need to be confirmed)  08/24/2019   FOOT EXAM  01/26/2020   URINE MICROALBUMIN  03/31/2021   HEMOGLOBIN A1C  04/09/2021   COVID-19 Vaccine (1) 05/05/2021 (Originally 02/10/1946)   Zoster Vaccines- Shingrix (1 of 2) 07/19/2021 (Originally 08/12/1966)   INFLUENZA VACCINE  11/03/2021 (Originally 03/06/2021)   OPHTHALMOLOGY EXAM  07/08/2021   TETANUS/TDAP  02/26/2026   Hepatitis C Screening  Completed   HPV VACCINES  Aged Out     ----------------------------------------------------------------------------------------------------------------------------------------------------------------------------------------------------------------- Physical Exam BP 117/73 (BP Location: Left Arm, Patient Position: Sitting, Cuff Size: Normal)   Pulse 80   Temp 98.6 F (37 C) (Oral)   Wt 181 lb 1.3 oz (82.1 kg)   SpO2 100%   BMI 24.56 kg/m   Physical Exam Constitutional:      Appearance: Normal appearance.  Eyes:     General: No scleral icterus. Cardiovascular:     Rate and Rhythm: Normal rate and regular rhythm.  Pulmonary:     Effort: Pulmonary effort is normal.     Breath sounds: Normal breath sounds.  Abdominal:     General: Abdomen is flat. There is no distension.     Palpations: Abdomen is soft.     Tenderness: There is no abdominal  tenderness.  Musculoskeletal:     Cervical back: Neck supple.  Neurological:     General: No focal deficit present.     Mental Status: He is alert.  Psychiatric:        Mood and Affect: Mood normal.        Behavior: Behavior normal.    ------------------------------------------------------------------------------------------------------------------------------------------------------------------------------------------------------------------- Assessment and Plan  Constipation Pulse constipation has resolved at this point.  He will continue MiraLAX and/or fiber supplement as needed.  Nausea Billy Armstrong continues to have nausea which I think may  be related to either reflux or possibly gastroparesis.  We will add PPI as well as Zofran as needed.  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  We will continue at current strength.   Meds ordered this encounter  Medications   pantoprazole (PROTONIX) 40 MG tablet    Sig: Take 1 tablet (40 mg total) by mouth daily.    Dispense:  30 tablet    Refill:  3   ondansetron (ZOFRAN ODT) 4 MG disintegrating tablet    Sig: Take 1 tablet (4 mg total) by mouth every 8 (eight) hours as needed for nausea or vomiting.    Dispense:  30 tablet    Refill:  3    No follow-ups on file.    This visit occurred during the SARS-CoV-2 public health emergency.  Safety protocols were in place, including screening questions prior to the visit, additional usage of staff PPE, and extensive cleaning of exam room while observing appropriate contact time as indicated for disinfecting solutions.

## 2021-05-02 ENCOUNTER — Ambulatory Visit (INDEPENDENT_AMBULATORY_CARE_PROVIDER_SITE_OTHER): Payer: 59 | Admitting: Gastroenterology

## 2021-05-02 ENCOUNTER — Other Ambulatory Visit: Payer: Self-pay

## 2021-05-02 ENCOUNTER — Encounter: Payer: Self-pay | Admitting: Gastroenterology

## 2021-05-02 VITALS — BP 106/66 | HR 70 | Ht 72.0 in | Wt 188.5 lb

## 2021-05-02 DIAGNOSIS — E1169 Type 2 diabetes mellitus with other specified complication: Secondary | ICD-10-CM

## 2021-05-02 DIAGNOSIS — R12 Heartburn: Secondary | ICD-10-CM | POA: Diagnosis not present

## 2021-05-02 DIAGNOSIS — K5909 Other constipation: Secondary | ICD-10-CM | POA: Diagnosis not present

## 2021-05-02 DIAGNOSIS — R112 Nausea with vomiting, unspecified: Secondary | ICD-10-CM | POA: Diagnosis not present

## 2021-05-02 DIAGNOSIS — Z1211 Encounter for screening for malignant neoplasm of colon: Secondary | ICD-10-CM | POA: Diagnosis not present

## 2021-05-02 DIAGNOSIS — F4381 Prolonged grief disorder: Secondary | ICD-10-CM

## 2021-05-02 DIAGNOSIS — F4329 Adjustment disorder with other symptoms: Secondary | ICD-10-CM

## 2021-05-02 NOTE — Patient Instructions (Signed)
If you are age 73 or older, your body mass index should be between 23-30. Your Body mass index is 25.57 kg/m. If this is out of the aforementioned range listed, please consider follow up with your Primary Care Provider.  If you are age 76 or younger, your body mass index should be between 19-25. Your Body mass index is 25.57 kg/m. If this is out of the aformentioned range listed, please consider follow up with your Primary Care Provider.   __________________________________________________________  The [place] [facility] providers would like to encourage you to use [facility] to communicate with providers for non-urgent requests or questions.  Due to long hold times on the telephone, sending your provider a message by [facility] may be a faster and more efficient way to get a response.  Please allow 48 business hours for a response.  Please remember that this is for non-urgent requests.  ___________________________________________________________  We have sent the following medications to your pharmacy for you to pick up at your convenience:  Miralax and Ducolax tablets.  ____________________________________________________________  Dennis Bast will be scheduled for a gastric emptying scan at [facility]. The schedulers will contact you to schedule the appointment. The procedure will be on ____________ at _________. Please arrive at least 15 minutes prior to your appointment for registration. Please make certain not to have anything to eat or drink after midnight the night before your test. Hold all stomach medications (ex: Zofran, phenergan, Reglan) 48 hours prior to your test. If you need to reschedule your appointment, please contact radiology scheduling at [phone]. _____________________________________________________________________ A gastric-emptying study measures how long it takes for food to move through your stomach. There are several ways to measure stomach emptying. In the most common test,  you eat food that contains a small amount of radioactive material. A scanner that detects the movement of the radioactive material is placed over your abdomen to monitor the rate at which food leaves your stomach. This test normally takes about 4 hours to complete. _____________________________________________________________________ Thank you for choosing me and [facility].  Vito Cirigliano, D.O.

## 2021-05-02 NOTE — Progress Notes (Signed)
Chief Complaint: GERD, nausea, constipation   Referring Provider:     Luetta Nutting, DO   HPI:     Billy Armstrong is a 75 y.o. male with a history of diabetes (Hgb A1c 8.8% in 10/2020), hyperlipidemia, depression, referred to the Gastroenterology Clinic for evaluation of reflux symptoms and nausea/vomiting.  Nausea/vomiting started in mid August.  Tends to be post prandial emesis; approx 30 mins after eating, but can also be nausea prior to meals. Independent of food type. Was seen in the ER for this issue on 04/03/2021. - Normal CBC, CMP, lipase - EKG unremarkable - CT abdomen/pelvis with increased stool burden but otherwise unremarkable - Discharged home with plan for MiraLAX, increase fluids, follow-up with PCM  Was seen in follow-up by Dr. Zigmund Daniel on 04/06/2021.  Constipation symptoms were improving with MiraLAX.  Added magnesium citrate with Senokot tabs with plan to transition to [place].  Was seen in follow-up by Dr. Zigmund Daniel on 04/19/2021.  Constipation improved.  Still with ongoing nausea and reported reflux symptoms (heartburn).  No dysphagia. Started on Protonix 40 mg/day and Zofran prn with GI referral.  Today, he states reflux symptoms have improved since starting Protonix 40 mg every morning.  Still has nausea, particularly in the morning. Nausea improves with Zofran prn- has used a couple times. No previous EGD.   Reports he has had constipation "for some time now"; predates the above n/v and reflux sxs. Constipation resolved with the above Rx, and now well controlled with Senakot alone. Now 1-2 BM/day. No straining, hematochezia, melena. Maintains hydration.   Of note, his wife died about 1.5 years ago and still with prolonged grief.  Endoscopic History: - Colonoscopy (08/2017, Dr. Ferdinand Lango): Inadequate prep.  Repeat in 2 years  Past Medical History:  Diagnosis Date   Diabetes mellitus without complication ([place])    Hyperlipidemia    Seborrheic dermatitis of  scalp 10/24/2018     Past Surgical History:  Procedure Laterality Date   EYE SURGERY     KNEE SURGERY     LEG SURGERY     Family History  Problem Relation Age of Onset   Diabetes Mother    Cancer Father    Breast cancer Sister    Lung cancer Brother    Colon cancer Neg Hx    Rectal cancer Neg Hx    Esophageal cancer Neg Hx    Pancreatic cancer Neg Hx    Social History   Tobacco Use   Smoking status: Never   Smokeless tobacco: Never  Vaping Use   Vaping Use: Never used  Substance Use Topics   Alcohol use: Yes    Alcohol/week: 0.0 standard drinks   Drug use: Yes   Current Outpatient Medications  Medication Sig Dispense Refill   AMBULATORY NON FORMULARY MEDICATION Glucometer of choice with test strips. Dispense QS x3 months. Test daily.  Controlled DM E11.69 100 each 12   AMBULATORY NON FORMULARY MEDICATION Lancets qs 1 daily testing for 90 days.  DM E11.69 Disp 90 day supply For one touch verio meter 1 each 99   aspirin 81 MG tablet Take 81 mg by mouth daily.     atorvastatin (LIPITOR) 40 MG tablet TAKE 1 TABLET BY MOUTH EVERY DAY 90 tablet 3   buPROPion (WELLBUTRIN XL) 150 MG 24 hr tablet Take 1 tablet (150 mg total) by mouth daily. 90 tablet 1   Calcium Citrate-Vitamin D (CALCIUM + D PO)  Take by mouth.     Cholecalciferol (D3 ADULT PO) Take by mouth.     Dulaglutide (TRULICITY) 1.5 WP/7.9YI SOPN Inject 1.5 mg into the skin once a week. 2 mL 2   Empagliflozin-metFORMIN HCl ER (SYNJARDY XR) 25-1000 MG TB24 Take 1 tablet by mouth daily. 90 tablet 2   glucose blood (ONETOUCH VERIO) test strip For one touch verio meter. Tesr daily 100 each 12   ibuprofen (ADVIL,MOTRIN) 200 MG tablet Take 200 mg by mouth every 6 (six) hours as needed.     Lancets (ONETOUCH ULTRASOFT) lancets Use as instructed.  Please dispense for onetouch verio meter. 100 each 12   Multiple Vitamins-Minerals (MULTIVITAMIN PO) Take by mouth.     Omega-3 Fatty Acids (FISH OIL PO) Take by mouth.      ondansetron (ZOFRAN ODT) 4 MG disintegrating tablet Take 1 tablet (4 mg total) by mouth every 8 (eight) hours as needed for nausea or vomiting. 30 tablet 3   pantoprazole (PROTONIX) 40 MG tablet Take 1 tablet (40 mg total) by mouth daily. 30 tablet 3   No current facility-administered medications for this visit.   No Known Allergies   Review of Systems: All systems reviewed and negative except where noted in HPI.     Physical Exam:    Wt Readings from Last 3 Encounters:  05/02/21 188 lb 8 oz (85.5 kg)  04/19/21 181 lb 1.3 oz (82.1 kg)  04/06/21 186 lb 6.4 oz (84.6 kg)    BP 106/66   Pulse 70   Ht 6' (1.829 m)   Wt 188 lb 8 oz (85.5 kg)   SpO2 98%   BMI 25.57 kg/m  Constitutional:  Pleasant, in no acute distress. Psychiatric: Normal mood and affect. Behavior is normal. Cardiovascular: Normal rate, regular rhythm. No edema Pulmonary/chest: Effort normal and breath sounds normal. No wheezing, rales or rhonchi. Abdominal: Soft, nondistended, nontender. Bowel sounds active throughout. There are no masses palpable. No hepatomegaly. Neurological: Alert and oriented to person place and time. Skin: Skin is warm and dry. No rashes noted.   ASSESSMENT AND PLAN;   1) Nausea/Vomiting 2) Heartburn - EGD to evaluate for mucosal/luminal pathology, gastritis, PUD, GOO, along with erosive esophagitis, LES laxity, hiatal hernia - Okay to resume Protonix and Zofran as prescribed - Gastric Emptying Study  3) Chronic constipation - Improved on current therapy - Maintain adequate hydration - Discussed relationship of chronic constipation and underlying diabetes.  Given improvement with current Rx, holding off on Sitz marker study - Colonoscopy to evaluate for mucosal/luminal pathology  4) Colon cancer screening - Due for routine colon cancer screening.  Plan for colonoscopy as above - Colonoscopy 2019 with inadequate prep - Plan for extended 2-day prep  5) Diabetes - Discussed  relationship of diabetes and GI symptomatology, to include GI symptoms of diabetes itself along with diabetes medication ADR's - Motility evaluation as above - Holding off on trial of Reglan or erythromycin pending GES study and endoscopic procedures  6) Prolonged grief disorder - Being treated by Dr. Zigmund Daniel - Potential overlap with GI symptomatology  The indications, risks, and benefits of EGD and colonoscopy were explained to the patient in detail. Risks include but are not limited to bleeding, perforation, adverse reaction to medications, and cardiopulmonary compromise. Sequelae include but are not limited to the possibility of surgery, hospitalization, and mortality. The patient verbalized understanding and wished to proceed. All questions answered, referred to scheduler and bowel prep ordered. Further recommendations pending results of the exam.  Lavena Bullion, DO, FACG  05/02/2021, 9:37 AM   Luetta Nutting, DO

## 2021-05-11 ENCOUNTER — Ambulatory Visit (AMBULATORY_SURGERY_CENTER): Payer: 59 | Admitting: Gastroenterology

## 2021-05-11 ENCOUNTER — Encounter: Payer: Self-pay | Admitting: Gastroenterology

## 2021-05-11 ENCOUNTER — Other Ambulatory Visit: Payer: Self-pay

## 2021-05-11 VITALS — BP 108/64 | HR 61 | Temp 97.7°F | Resp 17 | Ht 72.0 in | Wt 188.0 lb

## 2021-05-11 DIAGNOSIS — D124 Benign neoplasm of descending colon: Secondary | ICD-10-CM

## 2021-05-11 DIAGNOSIS — R12 Heartburn: Secondary | ICD-10-CM | POA: Diagnosis not present

## 2021-05-11 DIAGNOSIS — K2970 Gastritis, unspecified, without bleeding: Secondary | ICD-10-CM

## 2021-05-11 DIAGNOSIS — K449 Diaphragmatic hernia without obstruction or gangrene: Secondary | ICD-10-CM

## 2021-05-11 DIAGNOSIS — K2100 Gastro-esophageal reflux disease with esophagitis, without bleeding: Secondary | ICD-10-CM | POA: Diagnosis not present

## 2021-05-11 DIAGNOSIS — K5900 Constipation, unspecified: Secondary | ICD-10-CM

## 2021-05-11 DIAGNOSIS — K641 Second degree hemorrhoids: Secondary | ICD-10-CM

## 2021-05-11 DIAGNOSIS — Z1211 Encounter for screening for malignant neoplasm of colon: Secondary | ICD-10-CM

## 2021-05-11 DIAGNOSIS — R112 Nausea with vomiting, unspecified: Secondary | ICD-10-CM

## 2021-05-11 HISTORY — PX: COLONOSCOPY: SHX174

## 2021-05-11 HISTORY — PX: UPPER GASTROINTESTINAL ENDOSCOPY: SHX188

## 2021-05-11 MED ORDER — PANTOPRAZOLE SODIUM 40 MG PO TBEC: 40.0000 mg | DELAYED_RELEASE_TABLET | Freq: Two times a day (BID) | ORAL | 3 refills | Status: DC

## 2021-05-11 MED ORDER — SODIUM CHLORIDE 0.9 % IV SOLN: 500.0000 mL | Freq: Once | INTRAVENOUS | Status: DC

## 2021-05-11 NOTE — Progress Notes (Signed)
GASTROENTEROLOGY PROCEDURE H&P NOTE   Primary Care Physician: Luetta Nutting, DO    Reason for Procedure:   Nausea, Vomiting, GERD, Constipation  Plan:    EGD, Colonoscopy  Patient is appropriate for endoscopic procedure(s) in the ambulatory ([place]) setting.  The nature of the procedure, as well as the risks, benefits, and alternatives were carefully and thoroughly reviewed with the patient. Ample time for discussion and questions allowed. The patient understood, was satisfied, and agreed to proceed.     HPI: Billy Armstrong is a 75 y.o. male who presents for EGD and colonoscopy for evaluation of multiple GI symptoms to include nausea/vomiting, heartburn, constipation, CRC screening, and increased stool burden on CT.  Patient was most recently seen in the Gastroenterology Clinic on 05/02/2021.  No interval change in medical history since that appointment. Please refer to that note for full details regarding GI history and clinical presentation.   Past Medical History:  Diagnosis Date   Diabetes mellitus without complication ([place])    Hyperlipidemia    Seborrheic dermatitis of scalp 10/24/2018    Past Surgical History:  Procedure Laterality Date   EYE SURGERY     KNEE SURGERY     LEG SURGERY      Prior to Admission medications   Medication Sig Start Date End Date Taking? Authorizing Provider  AMBULATORY NON FORMULARY MEDICATION Glucometer of choice with test strips. Dispense QS x3 months. Test daily.  Controlled DM E11.69 07/25/18   Gregor Hams, MD  AMBULATORY NON FORMULARY MEDICATION Lancets qs 1 daily testing for 90 days.  DM E11.69 Disp 90 day supply For one touch verio meter 07/25/20   Luetta Nutting, DO  aspirin 81 MG tablet Take 81 mg by mouth daily.    [provider]  atorvastatin (LIPITOR) 40 MG tablet TAKE 1 TABLET BY MOUTH EVERY DAY 02/27/21   Luetta Nutting, DO  buPROPion (WELLBUTRIN XL) 150 MG 24 hr tablet Take 1 tablet (150 mg total) by mouth daily.  04/06/21   Luetta Nutting, DO  Calcium Citrate-Vitamin D (CALCIUM + D PO) Take by mouth.    [provider]  Cholecalciferol (D3 ADULT PO) Take by mouth.    [provider]  Dulaglutide (TRULICITY) 1.5 HQ/4.6NG SOPN Inject 1.5 mg into the skin once a week. 04/06/21 07/05/21  Luetta Nutting, DO  Empagliflozin-metFORMIN HCl ER (SYNJARDY XR) 25-1000 MG TB24 Take 1 tablet by mouth daily. 10/07/20   Luetta Nutting, DO  glucose blood (ONETOUCH VERIO) test strip For one touch verio meter. Tesr daily 07/25/20   Luetta Nutting, DO  ibuprofen (ADVIL,MOTRIN) 200 MG tablet Take 200 mg by mouth every 6 (six) hours as needed.    [provider]  Lancets [facility] ULTRASOFT) lancets Use as instructed.  Please dispense for onetouch verio meter. 07/25/20   Luetta Nutting, DO  Multiple Vitamins-Minerals (MULTIVITAMIN PO) Take by mouth.    [provider]  Omega-3 Fatty Acids (FISH OIL PO) Take by mouth.    [provider]  ondansetron (ZOFRAN ODT) 4 MG disintegrating tablet Take 1 tablet (4 mg total) by mouth every 8 (eight) hours as needed for nausea or vomiting. 04/19/21   Luetta Nutting, DO  pantoprazole (PROTONIX) 40 MG tablet Take 1 tablet (40 mg total) by mouth daily. 04/19/21   Luetta Nutting, DO    Current Outpatient Medications  Medication Sig Dispense Refill   AMBULATORY NON FORMULARY MEDICATION Glucometer of choice with test strips. Dispense QS x3 months. Test daily.  Controlled DM  E11.69 100 each 12   AMBULATORY NON FORMULARY MEDICATION Lancets qs 1 daily testing for 90 days.  DM E11.69 Disp 90 day supply For one touch verio meter 1 each 99   aspirin 81 MG tablet Take 81 mg by mouth daily.     atorvastatin (LIPITOR) 40 MG tablet TAKE 1 TABLET BY MOUTH EVERY DAY 90 tablet 3   buPROPion (WELLBUTRIN XL) 150 MG 24 hr tablet Take 1 tablet (150 mg total) by mouth daily. 90 tablet 1   Calcium Citrate-Vitamin D (CALCIUM + D PO) Take by mouth.     Cholecalciferol (D3  ADULT PO) Take by mouth.     Dulaglutide (TRULICITY) 1.5 WG/9.5AO SOPN Inject 1.5 mg into the skin once a week. 2 mL 2   Empagliflozin-metFORMIN HCl ER (SYNJARDY XR) 25-1000 MG TB24 Take 1 tablet by mouth daily. 90 tablet 2   glucose blood (ONETOUCH VERIO) test strip For one touch verio meter. Tesr daily 100 each 12   ibuprofen (ADVIL,MOTRIN) 200 MG tablet Take 200 mg by mouth every 6 (six) hours as needed.     Lancets (ONETOUCH ULTRASOFT) lancets Use as instructed.  Please dispense for onetouch verio meter. 100 each 12   Multiple Vitamins-Minerals (MULTIVITAMIN PO) Take by mouth.     Omega-3 Fatty Acids (FISH OIL PO) Take by mouth.     ondansetron (ZOFRAN ODT) 4 MG disintegrating tablet Take 1 tablet (4 mg total) by mouth every 8 (eight) hours as needed for nausea or vomiting. 30 tablet 3   pantoprazole (PROTONIX) 40 MG tablet Take 1 tablet (40 mg total) by mouth daily. 30 tablet 3   Current Facility-Administered Medications  Medication Dose Route Frequency Provider Last Rate Last Admin   0.9 %  sodium chloride infusion  500 mL Intravenous Once Leilah Polimeni V, DO        Allergies as of 05/11/2021   (No Known Allergies)    Family History  Problem Relation Age of Onset   Diabetes Mother    Cancer Father    Breast cancer Sister    Lung cancer Brother    Colon cancer Neg Hx    Rectal cancer Neg Hx    Esophageal cancer Neg Hx    Pancreatic cancer Neg Hx     Social History   Socioeconomic History   Marital status: Widowed    Spouse name: Not on file   Number of children: 3   Years of education: Not on file   Highest education level: Not on file  Occupational History   Not on file  Tobacco Use   Smoking status: Never   Smokeless tobacco: Never  Vaping Use   Vaping Use: Never used  Substance and Sexual Activity   Alcohol use: Yes    Alcohol/week: 0.0 standard drinks   Drug use: Yes   Sexual activity: Yes  Other Topics Concern   Not on file  Social History Narrative    Not on file   Social Determinants of Health   Financial Resource Strain: Not on file  Food Insecurity: Not on file  Transportation Needs: Not on file  Physical Activity: Not on file  Stress: Not on file  Social Connections: Not on file  Intimate Partner Violence: Not on file    Physical Exam: Vital signs in last 24 hours: @Temp  78 F (36.7 C) (Temporal)  GEN: NAD EYE: Sclerae anicteric ENT: MMM CV: Non-tachycardic Pulm: CTA b/l GI: Soft, NT/ND NEURO:  Alert & Oriented x 3  Gerrit Heck, DO [facility]   05/11/2021 1:21 PM

## 2021-05-11 NOTE — Progress Notes (Signed)
Pt arrived for procedure with upper lip sore. With placement of oral bite block , slight abrasion with sore. Ointment applied  Stable to RR. Report given  Pt comfortable

## 2021-05-11 NOTE — Patient Instructions (Signed)
Information on polyps, hemorrhoids, gastritis and hiatal hernia given to you today.  Await pathology results.  Resume previous diet and medications.  Increase Protonix to 40 mg by mouth for 6 weeks to promote mucosal healing then reduce back to 40 mg once  each day for continued control of reflux symptoms.  Repeat colonoscopy in 1 year due to suboptimal bowel prep.  Use a fiber supplement like Citrucel, Fibercon, Metamucil or Konsyl.    YOU HAD AN ENDOSCOPIC PROCEDURE TODAY AT [facility]:   Refer to the procedure report that was given to you for any specific questions about what was found during the examination.  If the procedure report does not answer your questions, please call your gastroenterologist to clarify.  If you requested that your care partner not be given the details of your procedure findings, then the procedure report has been included in a sealed envelope for you to review at your convenience later.  YOU SHOULD EXPECT: Some feelings of bloating in the abdomen. Passage of more gas than usual.  Walking can help get rid of the air that was put into your GI tract during the procedure and reduce the bloating. If you had a lower endoscopy (such as a colonoscopy or flexible sigmoidoscopy) you may notice spotting of blood in your stool or on the toilet paper. If you underwent a bowel prep for your procedure, you may not have a normal bowel movement for a few days.  Please Note:  You might notice some irritation and congestion in your nose or some drainage.  This is from the oxygen used during your procedure.  There is no need for concern and it should clear up in a day or so.  SYMPTOMS TO REPORT IMMEDIATELY:  Following lower endoscopy (colonoscopy or flexible sigmoidoscopy):  Excessive amounts of blood in the stool  Significant tenderness or worsening of abdominal pains  Swelling of the abdomen that is new, acute  Fever of 100F or higher  Following upper  endoscopy (EGD)  Vomiting of blood or coffee ground material  New chest pain or pain under the shoulder blades  Painful or persistently difficult swallowing  New shortness of breath  Fever of 100F or higher  Black, tarry-looking stools  For urgent or emergent issues, a gastroenterologist can be reached at any hour by calling [phone]. Do not use MyChart messaging for urgent concerns.    DIET:  We do recommend a small meal at first, but then you may proceed to your regular diet.  Drink plenty of fluids but you should avoid alcoholic beverages for 24 hours.  ACTIVITY:  You should plan to take it easy for the rest of today and you should NOT DRIVE or use heavy machinery until tomorrow (because of the sedation medicines used during the test).    FOLLOW UP: Our staff will call the number listed on your records 48-72 hours following your procedure to check on you and address any questions or concerns that you may have regarding the information given to you following your procedure. If we do not reach you, we will leave a message.  We will attempt to reach you two times.  During this call, we will ask if you have developed any symptoms of COVID 19. If you develop any symptoms (ie: fever, flu-like symptoms, shortness of breath, cough etc.) before then, please call [phone].  If you test positive for Covid 19 in the 2 weeks post procedure, please call and report this information  to [place].    If any biopsies were taken you will be contacted by phone or by letter within the next 1-3 weeks.  Please call us at [phone] if you have not heard about the biopsies in 3 weeks.    SIGNATURES/CONFIDENTIALITY: You and/or your care partner have signed paperwork which will be entered into your electronic medical record.  These signatures attest to the fact that that the information above on your After Visit Summary has been reviewed and is understood.  Full responsibility of the confidentiality of this  discharge information lies with you and/or your care-partner.

## 2021-05-11 NOTE — Op Note (Signed)
[facility] Patient Name: Billy Armstrong Procedure Date: 05/11/2021 1:38 PM MRN: [id number] Endoscopist: Gerrit Heck , MD Age: 75 Referring MD:  Date of Birth: September 08, 1945 Gender: Male Account #: [id number] Procedure:                Upper GI endoscopy Indications:              Heartburn, Suspected esophageal reflux, Nausea with                            vomiting Medicines:                Monitored Anesthesia Care Procedure:                Pre-Anesthesia Assessment:                           - Prior to the procedure, a History and Physical                            was performed, and patient medications and                            allergies were reviewed. The patient's tolerance of                            previous anesthesia was also reviewed. The risks                            and benefits of the procedure and the sedation                            options and risks were discussed with the patient.                            All questions were answered, and informed consent                            was obtained. Prior Anticoagulants: The patient has                            taken no previous anticoagulant or antiplatelet                            agents. ASA Grade Assessment: II - A patient with                            mild systemic disease. After reviewing the risks                            and benefits, the patient was deemed in                            satisfactory condition to undergo the procedure.  After obtaining informed consent, the endoscope was                            passed under direct vision. Throughout the                            procedure, the patient's blood pressure, pulse, and                            oxygen saturations were monitored continuously. The                            Endoscope was introduced through the mouth, and                            advanced to the second part of duodenum. The  upper                            GI endoscopy was accomplished without difficulty.                            The patient tolerated the procedure well. Scope In: Scope Out: Findings:                 LA Grade A (one or more mucosal breaks less than 5                            mm, not extending between tops of 2 mucosal folds)                            esophagitis with no bleeding was found in the lower                            third of the esophagus.                           A 1 cm hiatal hernia was present.                           The gastroesophageal flap valve was visualized                            endoscopically and classified as Hill Grade III                            (minimal fold, loose to endoscope, hiatal hernia                            likely).                           Mild inflammation characterized by erythema was                            found  in the gastric body and in the gastric                            antrum. Biopsies were taken with a cold forceps for                            Helicobacter pylori testing. Estimated blood loss                            was minimal.                           The examined duodenum was normal. Complications:            No immediate complications. Estimated Blood Loss:     Estimated blood loss was minimal. 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                           - 1 cm hiatal hernia.                           - Gastroesophageal flap valve classified as Hill                            Grade III (minimal fold, loose to endoscope, hiatal                            hernia likely).                           - Gastritis. Biopsied.                           - Normal examined duodenum. Recommendation:           - Patient has a contact number available for                            emergencies. The signs and symptoms of potential                            delayed complications were  discussed with the                            patient. Return to normal activities tomorrow.                            Written discharge instructions were provided to the                            patient.                           - Resume previous diet.                           -  Continue present medications.                           - Await pathology results.                           - Increase Protonix (pantoprazole) to 40 mg PO BID                            for 6 weeks to promote mucosal healing, then reduce                            back to 40 mg daily for continued control of reflux                            symptoms. Gerrit Heck, MD 05/11/2021 2:23:00 PM

## 2021-05-11 NOTE — Op Note (Signed)
[facility] Patient Name: Billy Armstrong Procedure Date: 05/11/2021 1:37 PM MRN: [id number] Endoscopist: Gerrit Heck , MD Age: 75 Referring MD:  Date of Birth: 03-Jan-1946 Gender: Male Account #: [id number] Procedure:                Colonoscopy Indications:              Constipation                           Colon Cancer screening                           Colonoscopy in 08/2017 at outside facility with                            suboptimal prep, with recommendation to repeat in 2                            years. Medicines:                Monitored Anesthesia Care Procedure:                Pre-Anesthesia Assessment:                           - Prior to the procedure, a History and Physical                            was performed, and patient medications and                            allergies were reviewed. The patient's tolerance of                            previous anesthesia was also reviewed. The risks                            and benefits of the procedure and the sedation                            options and risks were discussed with the patient.                            All questions were answered, and informed consent                            was obtained. Prior Anticoagulants: The patient has                            taken no previous anticoagulant or antiplatelet                            agents. ASA Grade Assessment: II - A patient with                            mild systemic disease.  After reviewing the risks                            and benefits, the patient was deemed in                            satisfactory condition to undergo the procedure.                           After obtaining informed consent, the colonoscope                            was passed under direct vision. Throughout the                            procedure, the patient's blood pressure, pulse, and                            oxygen saturations were monitored continuously.  The                            CF [id number] #[id number] was introduced through the anus                            and advanced to the the cecum, identified by                            appendiceal orifice and ileocecal valve. The                            colonoscopy was technically difficult and complex                            due to inadequate bowel prep. The patient tolerated                            the procedure well. The quality of the bowel                            preparation was fair. The ileocecal valve,                            appendiceal orifice, and rectum were photographed. Scope In: 1:51:19 PM Scope Out: 2:15:47 PM Scope Withdrawal Time: 0 hours 15 minutes 47 seconds  Total Procedure Duration: 0 hours 24 minutes 28 seconds  Findings:                 Hemorrhoids were found on perianal exam.                           A 3 mm polyp was found in the descending colon. The                            polyp was sessile. The polyp was removed with  a                            cold snare. Resection was complete, but the polyp                            tissue was not retrieved. Estimated blood loss was                            minimal.                           A moderate amount of semi-liquid stool was found in                            the entire colon, interfering with visualization.                            Lavage of the area was performed using copious                            amounts of tap water, resulting in incomplete                            clearance with fair visualization.                           Non-bleeding internal hemorrhoids were found during                            retroflexion. The hemorrhoids were small and Grade                            II (internal hemorrhoids that prolapse but reduce                            spontaneously). Complications:            No immediate complications. Estimated Blood Loss:     Estimated blood loss was  minimal. Impression:               - Preparation of the colon was fair.                           - Hemorrhoids found on perianal exam.                           - One 3 mm polyp in the descending colon, removed                            with a cold snare. Complete resection. Polyp tissue                            not retrieved.                           -  Stool in the entire examined colon.                           - Non-bleeding internal hemorrhoids. Recommendation:           - Patient has a contact number available for                            emergencies. The signs and symptoms of potential                            delayed complications were discussed with the                            patient. Return to normal activities tomorrow.                            Written discharge instructions were provided to the                            patient.                           - Resume previous diet.                           - Continue present medications.                           - Await pathology results.                           - Repeat colonoscopy within 1 year because the                            bowel preparation was suboptimal.                           - Recommend extended 2-day prep with repeat                            colonoscopy, to include Dulcolax 10 mg PO BID x2                            days, clear liquids x2 days, Miralax 2 cap BID x2                            days, then bowel preparation.                           - Return to GI clinic PRN.                           - Use fiber, for example Citrucel, Fibercon, [employer]  or Metamucil.                           - Internal hemorrhoids were noted on this study and                            may be amenable to hemorrhoid band ligation. If you                            are interested in further treatment of these                            hemorrhoids with band ligation, please contact  my                            clinic to set up an appointment for evaluation and                            treatment. Gerrit Heck, MD 05/11/2021 2:28:47 PM

## 2021-05-11 NOTE — Progress Notes (Signed)
Pt's states no medical or surgical changes since previsit or office visit.  ° °VS DT °

## 2021-05-15 ENCOUNTER — Telehealth: Payer: Self-pay

## 2021-05-15 NOTE — Telephone Encounter (Signed)
  Follow up Call-  Call back number 05/11/2021  Post procedure Call Back phone  # [phone]  Permission to leave phone message Yes  Some recent data might be hidden     Patient questions:  Do you have a fever, pain , or abdominal swelling? No. Pain Score  0 *  Have you tolerated food without any problems? Yes.    Have you been able to return to your normal activities? Yes.    Do you have any questions about your discharge instructions: Diet   No. Medications  No. Follow up visit  No.  Do you have questions or concerns about your Care? No.  Actions: * If pain score is 4 or above: No action needed, pain <4.

## 2021-05-16 ENCOUNTER — Ambulatory Visit (HOSPITAL_COMMUNITY)
Admission: RE | Admit: 2021-05-16 | Discharge: 2021-05-16 | Disposition: A | Discharge status: Home / Self Care | Payer: 59 | Source: Ambulatory Visit | Attending: Gastroenterology | Admitting: Gastroenterology

## 2021-05-16 DIAGNOSIS — R112 Nausea with vomiting, unspecified: Secondary | ICD-10-CM | POA: Diagnosis not present

## 2021-05-16 DIAGNOSIS — K5909 Other constipation: Secondary | ICD-10-CM | POA: Insufficient documentation

## 2021-05-16 DIAGNOSIS — R12 Heartburn: Secondary | ICD-10-CM | POA: Insufficient documentation

## 2021-05-16 MED ORDER — TECHNETIUM TC 99M SULFUR COLLOID
2.2000 | Freq: Once | INTRAVENOUS | Status: AC | PRN
  Administered 2021-05-16: 2.2 via INTRAVENOUS

## 2021-05-23 ENCOUNTER — Encounter: Payer: Self-pay | Admitting: Gastroenterology

## 2021-06-26 ENCOUNTER — Other Ambulatory Visit: Payer: Self-pay | Admitting: Family Medicine

## 2021-06-26 DIAGNOSIS — E1169 Type 2 diabetes mellitus with other specified complication: Secondary | ICD-10-CM

## 2021-07-14 LAB — HM DIABETES EYE EXAM

## 2021-07-16 ENCOUNTER — Other Ambulatory Visit: Payer: Self-pay | Admitting: Family Medicine

## 2021-07-16 DIAGNOSIS — K2990 Gastroduodenitis, unspecified, without bleeding: Secondary | ICD-10-CM

## 2021-07-16 DIAGNOSIS — K2100 Gastro-esophageal reflux disease with esophagitis, without bleeding: Secondary | ICD-10-CM

## 2021-07-16 DIAGNOSIS — K2970 Gastritis, unspecified, without bleeding: Secondary | ICD-10-CM

## 2021-09-16 ENCOUNTER — Other Ambulatory Visit: Payer: Self-pay | Admitting: Family Medicine

## 2021-09-16 DIAGNOSIS — E1169 Type 2 diabetes mellitus with other specified complication: Secondary | ICD-10-CM

## 2021-09-28 ENCOUNTER — Other Ambulatory Visit: Payer: Self-pay | Admitting: Family Medicine

## 2021-09-28 DIAGNOSIS — E1169 Type 2 diabetes mellitus with other specified complication: Secondary | ICD-10-CM

## 2021-09-30 ENCOUNTER — Other Ambulatory Visit: Payer: Self-pay | Admitting: Family Medicine

## 2021-10-19 ENCOUNTER — Other Ambulatory Visit: Payer: Self-pay | Admitting: Family Medicine

## 2021-10-31 ENCOUNTER — Other Ambulatory Visit: Payer: Self-pay | Admitting: Family Medicine

## 2021-11-04 ENCOUNTER — Other Ambulatory Visit: Payer: Self-pay | Admitting: Family Medicine

## 2021-11-08 NOTE — Telephone Encounter (Signed)
Pls call and schedule pt for 32-monthfollow-up appt with Dr. MZigmund Danielfor continued med refills. Sending 30 day refill.  ? ?Thanks ?

## 2021-11-08 NOTE — Telephone Encounter (Signed)
Patient scheduled.

## 2021-11-22 ENCOUNTER — Other Ambulatory Visit: Payer: Self-pay | Admitting: Family Medicine

## 2021-12-08 ENCOUNTER — Encounter: Payer: Self-pay | Admitting: Family Medicine

## 2021-12-08 ENCOUNTER — Ambulatory Visit (INDEPENDENT_AMBULATORY_CARE_PROVIDER_SITE_OTHER): Payer: 59 | Admitting: Family Medicine

## 2021-12-08 VITALS — BP 102/58 | HR 70 | Wt 168.0 lb

## 2021-12-08 DIAGNOSIS — E785 Hyperlipidemia, unspecified: Secondary | ICD-10-CM | POA: Diagnosis not present

## 2021-12-08 DIAGNOSIS — E1169 Type 2 diabetes mellitus with other specified complication: Secondary | ICD-10-CM

## 2021-12-08 LAB — POCT GLYCOSYLATED HEMOGLOBIN (HGB A1C): Hemoglobin A1C: 14 % — AB (ref 4.0–5.6)

## 2021-12-08 MED ORDER — TOUJEO MAX SOLOSTAR 300 UNIT/ML ~~LOC~~ SOPN
20.0000 [IU] | PEN_INJECTOR | Freq: Every day | SUBCUTANEOUS | 1 refills | Status: DC
Start: 2021-12-08 — End: 2022-03-22

## 2021-12-08 MED ORDER — OZEMPIC (1 MG/DOSE) 4 MG/3ML ~~LOC~~ SOPN: 1.0000 mg | PEN_INJECTOR | SUBCUTANEOUS | 1 refills | Status: DC

## 2021-12-08 MED ORDER — BD PEN NEEDLE NANO U/F 32G X 4 MM MISC: 12 refills | Status: DC

## 2021-12-08 NOTE — Patient Instructions (Signed)
Let's change trulicity to Ozempic '1mg'$  weekly.  ?Start Toujeo Max (Insulin) 20 units once per day.  ?Continue Synjardy.  ?Follow up in 3 months.  ? ?Diabetes Mellitus and Nutrition, Adult ?When you have diabetes, or diabetes mellitus, it is very important to have healthy eating habits because your blood sugar (glucose) levels are greatly affected by what you eat and drink. Eating healthy foods in the right amounts, at about the same times every day, can help you: ?Manage your blood glucose. ?Lower your risk of heart disease. ?Improve your blood pressure. ?Reach or maintain a healthy weight. ?What can affect my meal plan? ?Every person with diabetes is different, and each person has different needs for a meal plan. Your health care provider may recommend that you work with a dietitian to make a meal plan that is best for you. Your meal plan may vary depending on factors such as: ?The calories you need. ?The medicines you take. ?Your weight. ?Your blood glucose, blood pressure, and cholesterol levels. ?Your activity level. ?Other health conditions you have, such as heart or kidney disease. ?How do carbohydrates affect me? ?Carbohydrates, also called carbs, affect your blood glucose level more than any other type of food. Eating carbs raises the amount of glucose in your blood. ?It is important to know how many carbs you can safely have in each meal. This is different for every person. Your dietitian can help you calculate how many carbs you should have at each meal and for each snack. ?How does alcohol affect me? ?Alcohol can cause a decrease in blood glucose (hypoglycemia), especially if you use insulin or take certain diabetes medicines by mouth. Hypoglycemia can be a life-threatening condition. Symptoms of hypoglycemia, such as sleepiness, dizziness, and confusion, are similar to symptoms of having too much alcohol. ?Do not drink alcohol if: ?Your health care provider tells you not to drink. ?You are pregnant, may  be pregnant, or are planning to become pregnant. ?If you drink alcohol: ?Limit how much you have to: ?0-1 drink a day for women. ?0-2 drinks a day for men. ?Know how much alcohol is in your drink. In the [place] one drink equals one 12 oz bottle of beer (355 mL), one 5 oz glass of wine (148 mL), or one 1? oz glass of hard liquor (44 mL). ?Keep yourself hydrated with water, diet soda, or unsweetened iced tea. Keep in mind that regular soda, juice, and other mixers may contain a lot of sugar and must be counted as carbs. ?What are tips for following this plan? ? ?Reading food labels ?Start by checking the serving size on the Nutrition Facts label of packaged foods and drinks. The number of calories and the amount of carbs, fats, and other nutrients listed on the label are based on one serving of the item. Many items contain more than one serving per package. ?Check the total grams (g) of carbs in one serving. ?Check the number of grams of saturated fats and trans fats in one serving. Choose foods that have a low amount or none of these fats. ?Check the number of milligrams (mg) of salt (sodium) in one serving. Most people should limit total sodium intake to less than 2,300 mg per day. ?Always check the nutrition information of foods labeled as "low-fat" or "nonfat." These foods may be higher in added sugar or refined carbs and should be avoided. ?Talk to your dietitian to identify your daily goals for nutrients listed on the label. ?Shopping ?Avoid buying canned, pre-made,  or processed foods. These foods tend to be high in fat, sodium, and added sugar. ?Shop around the outside edge of the grocery store. This is where you will most often find fresh fruits and vegetables, bulk grains, fresh meats, and fresh dairy products. ?Cooking ?Use low-heat cooking methods, such as baking, instead of high-heat cooking methods, such as deep frying. ?Cook using healthy oils, such as olive, canola, or sunflower oil. ?Avoid cooking with  butter, cream, or high-fat meats. ?Meal planning ?Eat meals and snacks regularly, preferably at the same times every day. Avoid going long periods of time without eating. ?Eat foods that are high in fiber, such as fresh fruits, vegetables, beans, and whole grains. ?Eat 4-6 oz (112-168 g) of lean protein each day, such as lean meat, chicken, fish, eggs, or tofu. One ounce (oz) (28 g) of lean protein is equal to: ?1 oz (28 g) of meat, chicken, or fish. ?1 egg. ?? cup (62 g) of tofu. ?Eat some foods each day that contain healthy fats, such as avocado, nuts, seeds, and fish. ?What foods should I eat? ?Fruits ?Berries. Apples. Oranges. Peaches. Apricots. Plums. Grapes. Mangoes. Papayas. Pomegranates. Kiwi. Cherries. ?Vegetables ?Leafy greens, including lettuce, spinach, kale, chard, collard greens, mustard greens, and cabbage. Beets. Cauliflower. Broccoli. Carrots. Green beans. Tomatoes. Peppers. Onions. Cucumbers. Brussels sprouts. ?Grains ?Whole grains, such as whole-wheat or whole-grain bread, crackers, tortillas, cereal, and pasta. Unsweetened oatmeal. Quinoa. Brown or wild rice. ?Meats and other proteins ?Seafood. Poultry without skin. Lean cuts of poultry and beef. Tofu. Nuts. Seeds. ?Dairy ?Low-fat or fat-free dairy products such as milk, yogurt, and cheese. ?The items listed above may not be a complete list of foods and beverages you can eat and drink. Contact a dietitian for more information. ?What foods should I avoid? ?Fruits ?Fruits canned with syrup. ?Vegetables ?Canned vegetables. Frozen vegetables with butter or cream sauce. ?Grains ?Refined white flour and flour products such as bread, pasta, snack foods, and cereals. Avoid all processed foods. ?Meats and other proteins ?Fatty cuts of meat. Poultry with skin. Breaded or fried meats. Processed meat. Avoid saturated fats. ?Dairy ?Full-fat yogurt, cheese, or milk. ?Beverages ?Sweetened drinks, such as soda or iced tea. ?The items listed above may not be a  complete list of foods and beverages you should avoid. Contact a dietitian for more information. ?Questions to ask a health care provider ?Do I need to meet with a certified diabetes care and education specialist? ?Do I need to meet with a dietitian? ?What number can I call if I have questions? ?When are the best times to check my blood glucose? ?Where to find more information: ?American Diabetes Association: diabetes.org ?Academy of Nutrition and Dietetics: eatright.org ?[employer] of Diabetes and Digestive and Kidney Diseases: AmenCredit.is ?Association of Diabetes Care & Education Specialists: diabeteseducator.org ?Summary ?It is important to have healthy eating habits because your blood sugar (glucose) levels are greatly affected by what you eat and drink. It is important to use alcohol carefully. ?A healthy meal plan will help you manage your blood glucose and lower your risk of heart disease. ?Your health care provider may recommend that you work with a dietitian to make a meal plan that is best for you. ?This information is not intended to replace advice given to you by your health care provider. Make sure you discuss any questions you have with your health care provider. ?Document Revised: 02/24/2020 Document Reviewed: 02/24/2020 ?Elsevier Patient Education ? [place]. ? ?

## 2021-12-09 LAB — COMPLETE METABOLIC PANEL WITH GFR
AG Ratio: 1.9 (calc) (ref 1.0–2.5)
ALT: 26 U/L (ref 9–46)
AST: 16 U/L (ref 10–35)
Albumin: 4.3 g/dL (ref 3.6–5.1)
Alkaline phosphatase (APISO): 125 U/L (ref 35–144)
BUN: 17 mg/dL (ref 7–25)
CO2: 29 mmol/L (ref 20–32)
Calcium: 9.6 mg/dL (ref 8.6–10.3)
Chloride: 101 mmol/L (ref 98–110)
Creat: 0.74 mg/dL (ref 0.70–1.28)
Globulin: 2.3 g/dL (calc) (ref 1.9–3.7)
Glucose, Bld: 291 mg/dL — ABNORMAL HIGH (ref 65–99)
Potassium: 4.9 mmol/L (ref 3.5–5.3)
Sodium: 140 mmol/L (ref 135–146)
Total Bilirubin: 0.8 mg/dL (ref 0.2–1.2)
Total Protein: 6.6 g/dL (ref 6.1–8.1)
eGFR: 95 mL/min/{1.73_m2} (ref >=60)

## 2021-12-09 LAB — LIPID PANEL W/REFLEX DIRECT LDL
Cholesterol: 108 mg/dL (ref <200)
HDL: 57 mg/dL (ref >=40)
LDL Cholesterol (Calc): 37 mg/dL (calc)
Non-HDL Cholesterol (Calc): 51 mg/dL (calc) (ref <130)
Total CHOL/HDL Ratio: 1.9 (calc) (ref <5.0)
Triglycerides: 63 mg/dL (ref <150)

## 2021-12-09 LAB — MICROALBUMIN / CREATININE URINE RATIO
Creatinine, Urine: 33 mg/dL (ref 20–320)
Microalb Creat Ratio: 6 mcg/mg creat (ref <30)
Microalb, Ur: 0.2 mg/dL

## 2021-12-09 LAB — CBC WITH DIFFERENTIAL/PLATELET
Absolute Monocytes: 744 cells/uL (ref 200–950)
Basophils Absolute: 48 cells/uL (ref 0–200)
Basophils Relative: 0.8 %
Eosinophils Absolute: 168 cells/uL (ref 15–500)
Eosinophils Relative: 2.8 %
HCT: 41.2 % (ref 38.5–50.0)
Hemoglobin: 14.1 g/dL (ref 13.2–17.1)
Lymphs Abs: 1590 cells/uL (ref 850–3900)
MCH: 33.7 pg — ABNORMAL HIGH (ref 27.0–33.0)
MCHC: 34.2 g/dL (ref 32.0–36.0)
MCV: 98.3 fL (ref 80.0–100.0)
MPV: 11.4 fL (ref 7.5–12.5)
Monocytes Relative: 12.4 %
Neutro Abs: 3450 cells/uL (ref 1500–7800)
Neutrophils Relative %: 57.5 %
Platelets: 184 10*3/uL (ref 140–400)
RBC: 4.19 10*6/uL — ABNORMAL LOW (ref 4.20–5.80)
RDW: 11.9 % (ref 11.0–15.0)
Total Lymphocyte: 26.5 %
WBC: 6 10*3/uL (ref 3.8–10.8)

## 2021-12-09 LAB — HEMOGLOBIN A1C: Hgb A1c MFr Bld: >14 % of total Hgb — ABNORMAL HIGH (ref <5.7)

## 2021-12-10 ENCOUNTER — Encounter: Payer: Self-pay | Admitting: Family Medicine

## 2021-12-10 NOTE — Assessment & Plan Note (Signed)
Tolerating atorvastatin well.  Updating lipid panel. 

## 2021-12-10 NOTE — Assessment & Plan Note (Signed)
Diabetes is poorly controlled at this time with a1c of >14.  Will order updated labs including CMP, CBC and lipid panel.  He is symptomatic with significant weight loss related to his diabetes.  Starting Toujeo max 20 units daily.  Changing Trulicity to Ozempic.  Discussed changing diet to help with blood sugar control as well.   ?

## 2021-12-10 NOTE — Progress Notes (Signed)
?Billy Armstrong - 76 y.o. male MRN [id number]  Date of birth: 20-Oct-1945 ? ?Subjective ?Chief Complaint  ?Patient presents with  ? Diabetes  ? ? ?HPI ?Billy Armstrong is a 76 y.o. male here today for follow up visit.   ? ?He has felt fatigued.  Blood sugars have not been well controlled recently.  A1c today is >14.  He has had increased urination and feels thirsty frequently.  Weight is down about 20lbs since 9/22.  He admits that diet has been pretty poor.  He is a taking medications as directed.   ? ?Continues to do well with atorvastatin for management of HLD ? ?ROS:  A comprehensive ROS was completed and negative except as noted per HPI ? ?No Known Allergies ? ?Past Medical History:  ?Diagnosis Date  ? Diabetes mellitus without complication ([place])   ? Hyperlipidemia   ? Seborrheic dermatitis of scalp 10/24/2018  ? ? ?Past Surgical History:  ?Procedure Laterality Date  ? COLONOSCOPY  05/11/2021  ? EYE SURGERY    ? KNEE SURGERY    ? LEG SURGERY    ? UPPER GASTROINTESTINAL ENDOSCOPY  05/11/2021  ? ? ?Social History  ? ?Socioeconomic History  ? Marital status: Widowed  ?  Spouse name: Not on file  ? Number of children: 3  ? Years of education: Not on file  ? Highest education level: Not on file  ?Occupational History  ? Not on file  ?Tobacco Use  ? Smoking status: Never  ? Smokeless tobacco: Never  ?Vaping Use  ? Vaping Use: Never used  ?Substance and Sexual Activity  ? Alcohol use: Yes  ?  Alcohol/week: 0.0 standard drinks  ? Drug use: Yes  ? Sexual activity: Yes  ?Other Topics Concern  ? Not on file  ?Social History Narrative  ? Not on file  ? ?Social Determinants of Health  ? ?Financial Resource Strain: Not on file  ?Food Insecurity: Not on file  ?Transportation Needs: Not on file  ?Physical Activity: Not on file  ?Stress: Not on file  ?Social Connections: Not on file  ? ? ?Family History  ?Problem Relation Age of Onset  ? Diabetes Mother   ? Cancer Father   ? Breast cancer Sister   ? Lung cancer Brother   ? Colon cancer  Neg Hx   ? Rectal cancer Neg Hx   ? Esophageal cancer Neg Hx   ? Pancreatic cancer Neg Hx   ? ? ?Health Maintenance  ?Topic Date Due  ? COVID-19 Vaccine (1) Never done  ? Zoster Vaccines- Shingrix (1 of 2) Never done  ? FOOT EXAM  01/26/2020  ? INFLUENZA VACCINE  03/06/2022  ? COLONOSCOPY (Pts 45-30yr Insurance coverage will need to be confirmed)  05/11/2022  ? HEMOGLOBIN A1C  06/10/2022  ? OPHTHALMOLOGY EXAM  07/14/2022  ? URINE MICROALBUMIN  12/09/2022  ? TETANUS/TDAP  02/26/2026  ? Pneumonia Vaccine 76 Years old  Completed  ? Hepatitis C Screening  Completed  ? HPV VACCINES  Aged Out  ? ? ? ?----------------------------------------------------------------------------------------------------------------------------------------------------------------------------------------------------------------- ?Physical Exam ?BP (!) 102/58   Pulse 70   Wt 168 lb (76.2 kg)   SpO2 99%   BMI 22.78 kg/m?  ? ?Physical Exam ?Constitutional:   ?   Appearance: Normal appearance.  ?Eyes:  ?   General: No scleral icterus. ?Cardiovascular:  ?   Rate and Rhythm: Normal rate and regular rhythm.  ?Pulmonary:  ?   Effort: Pulmonary effort is normal.  ?   Breath sounds:  Normal breath sounds.  ?Musculoskeletal:  ?   Cervical back: Neck supple.  ?Neurological:  ?   Mental Status: He is alert.  ?Psychiatric:     ?   Mood and Affect: Mood normal.     ?   Behavior: Behavior normal.  ? ? ?------------------------------------------------------------------------------------------------------------------------------------------------------------------------------------------------------------------- ?Assessment and Plan ? ?Diabetes mellitus ([place]) ?Diabetes is poorly controlled at this time with a1c of >14.  Will order updated labs including CMP, CBC and lipid panel.  He is symptomatic with significant weight loss related to his diabetes.  Starting Toujeo max 20 units daily.  Changing Trulicity to Ozempic.  Discussed changing diet to help with  blood sugar control as well.   ? ?Hyperlipidemia associated with type 2 diabetes mellitus ([place]) ?Tolerating atorvastatin well.  Updating lipid panel.  ? ? ?Meds ordered this encounter  ?Medications  ? Semaglutide, 1 MG/DOSE, (OZEMPIC, 1 MG/DOSE,) 4 MG/3ML SOPN  ?  Sig: Inject 1 mg into the skin once a week.  ?  Dispense:  9 mL  ?  Refill:  1  ? insulin glargine, 2 Unit Dial, (TOUJEO MAX SOLOSTAR) 300 UNIT/ML Solostar Pen  ?  Sig: Inject 20 Units into the skin daily.  ?  Dispense:  6 mL  ?  Refill:  1  ? Insulin Pen Needle (BD PEN NEEDLE NANO U/F) 32G X 4 MM MISC  ?  Sig: Use to inject insulin daily  ?  Dispense:  50 each  ?  Refill:  12  ? ? ?Return in about 3 months (around 03/10/2022) for T2DM. ? ? ? ?This visit occurred during the SARS-CoV-2 public health emergency.  Safety protocols were in place, including screening questions prior to the visit, additional usage of staff PPE, and extensive cleaning of exam room while observing appropriate contact time as indicated for disinfecting solutions.  ? ?

## 2022-01-03 ENCOUNTER — Other Ambulatory Visit: Payer: Self-pay | Admitting: Family Medicine

## 2022-01-03 DIAGNOSIS — E1169 Type 2 diabetes mellitus with other specified complication: Secondary | ICD-10-CM

## 2022-01-20 ENCOUNTER — Other Ambulatory Visit: Payer: Self-pay | Admitting: Family Medicine

## 2022-01-20 DIAGNOSIS — K2100 Gastro-esophageal reflux disease with esophagitis, without bleeding: Secondary | ICD-10-CM

## 2022-01-20 DIAGNOSIS — K2970 Gastritis, unspecified, without bleeding: Secondary | ICD-10-CM

## 2022-03-09 ENCOUNTER — Ambulatory Visit: Payer: 59 | Admitting: Family Medicine

## 2022-03-22 ENCOUNTER — Ambulatory Visit (INDEPENDENT_AMBULATORY_CARE_PROVIDER_SITE_OTHER): Payer: 59 | Admitting: Family Medicine

## 2022-03-22 ENCOUNTER — Encounter: Payer: Self-pay | Admitting: Family Medicine

## 2022-03-22 VITALS — BP 90/59 | HR 85 | Temp 97.5°F | Ht 72.0 in | Wt 165.0 lb

## 2022-03-22 DIAGNOSIS — E1169 Type 2 diabetes mellitus with other specified complication: Secondary | ICD-10-CM | POA: Diagnosis not present

## 2022-03-22 DIAGNOSIS — I959 Hypotension, unspecified: Secondary | ICD-10-CM | POA: Diagnosis not present

## 2022-03-22 DIAGNOSIS — E785 Hyperlipidemia, unspecified: Secondary | ICD-10-CM | POA: Diagnosis not present

## 2022-03-22 LAB — POCT GLYCOSYLATED HEMOGLOBIN (HGB A1C): HbA1c, POC (controlled diabetic range): 6.9 % (ref 0.0–7.0)

## 2022-03-22 MED ORDER — TOUJEO MAX SOLOSTAR 300 UNIT/ML ~~LOC~~ SOPN: 20.0000 [IU] | PEN_INJECTOR | Freq: Every day | SUBCUTANEOUS | 1 refills | Status: DC

## 2022-03-22 NOTE — Assessment & Plan Note (Addendum)
Lab Results  Component Value Date   LDLCALC 37 12/08/2021    Doing well with atorvastatin, continue at current strength.

## 2022-03-22 NOTE — Progress Notes (Signed)
Billy Armstrong - 76 y.o. male MRN [id number]  Date of birth: 03-May-1946  Subjective Chief Complaint  Patient presents with   Follow-up    HPI Billy Armstrong is a 76 y.o. male here today for follow up.   Reports that he is doing well.  A1c >14 at last visit.  Added toujeo and changed trulicity to Ozempic.  He has made some changes to diet.  He has less of an appetite and has some nausea with change to Ozempic but overall tolerating well.   Tolerating atorvastatin well at current strength.   ROS:  A comprehensive ROS was completed and negative except as noted per HPI    No Known Allergies  Past Medical History:  Diagnosis Date   Diabetes mellitus without complication ([place])    Hyperlipidemia    Seborrheic dermatitis of scalp 10/24/2018    Past Surgical History:  Procedure Laterality Date   COLONOSCOPY  05/11/2021   EYE SURGERY     KNEE SURGERY     LEG SURGERY     UPPER GASTROINTESTINAL ENDOSCOPY  05/11/2021    Social History   Socioeconomic History   Marital status: Widowed    Spouse name: Not on file   Number of children: 3   Years of education: Not on file   Highest education level: Not on file  Occupational History   Not on file  Tobacco Use   Smoking status: Never   Smokeless tobacco: Never  Vaping Use   Vaping Use: Never used  Substance and Sexual Activity   Alcohol use: Yes    Alcohol/week: 0.0 standard drinks of alcohol   Drug use: Yes   Sexual activity: Yes  Other Topics Concern   Not on file  Social History Narrative   Not on file   Social Determinants of Health   Financial Resource Strain: Not on file  Food Insecurity: Not on file  Transportation Needs: Not on file  Physical Activity: Not on file  Stress: Not on file  Social Connections: Not on file    Family History  Problem Relation Age of Onset   Diabetes Mother    Cancer Father    Breast cancer Sister    Lung cancer Brother    Colon cancer Neg Hx    Rectal cancer Neg Hx     Esophageal cancer Neg Hx    Pancreatic cancer Neg Hx     Health Maintenance  Topic Date Due   COVID-19 Vaccine (1) Never done   Zoster Vaccines- Shingrix (1 of 2) Never done   INFLUENZA VACCINE  11/04/2022 (Originally 03/06/2022)   COLONOSCOPY (Pts 45-38yr Insurance coverage will need to be confirmed)  05/11/2022   HEMOGLOBIN A1C  06/10/2022   OPHTHALMOLOGY EXAM  07/14/2022   Diabetic kidney evaluation - GFR measurement  12/09/2022   Diabetic kidney evaluation - Urine ACR  12/09/2022   FOOT EXAM  03/23/2023   TETANUS/TDAP  02/26/2026   Pneumonia Vaccine 76 Years old  Completed   Hepatitis C Screening  Completed   HPV VACCINES  Aged Out     ----------------------------------------------------------------------------------------------------------------------------------------------------------------------------------------------------------------- Physical Exam BP (!) 90/59   Pulse 85   Temp (!) 97.5 F (36.4 C) (Temporal)   Ht 6' (1.829 m)   Wt 165 lb (74.8 kg)   SpO2 99%   BMI 22.38 kg/m   Physical Exam Constitutional:      Appearance: Normal appearance.  Cardiovascular:     Rate and Rhythm: Normal rate and regular rhythm.  Pulmonary:  Effort: Pulmonary effort is normal.     Breath sounds: Normal breath sounds.  Musculoskeletal:     Cervical back: Neck supple.  Neurological:     Mental Status: He is alert.  Psychiatric:        Mood and Affect: Mood normal.        Behavior: Behavior normal.     ------------------------------------------------------------------------------------------------------------------------------------------------------------------------------------------------------------------- Assessment and Plan  Hyperlipidemia associated with type 2 diabetes mellitus [facility]) Lab Results  Component Value Date   LDLCALC 37 12/08/2021    Doing well with atorvastatin, continue at current strength.    Diabetes mellitus ([place]) Diabetes is much  better controlled. Continue current medications for management of diabetes.  Return in about 4 months (around 07/22/2022) for HTN/T2DM.   Hypotension His BP typically runs low.  He denies symptoms related to this.  Not on any antihypertensive regimen.    No orders of the defined types were placed in this encounter.   Return in about 4 months (around 07/22/2022) for HTN/T2DM.    This visit occurred during the SARS-CoV-2 public health emergency.  Safety protocols were in place, including screening questions prior to the visit, additional usage of staff PPE, and extensive cleaning of exam room while observing appropriate contact time as indicated for disinfecting solutions.

## 2022-03-22 NOTE — Assessment & Plan Note (Signed)
Diabetes is much better controlled. Continue current medications for management of diabetes.  Return in about 4 months (around 07/22/2022) for HTN/T2DM.

## 2022-03-22 NOTE — Assessment & Plan Note (Signed)
His BP typically runs low.  He denies symptoms related to this.  Not on any antihypertensive regimen.

## 2022-03-29 ENCOUNTER — Other Ambulatory Visit: Payer: Self-pay | Admitting: Family Medicine

## 2022-06-26 ENCOUNTER — Encounter: Payer: Self-pay | Admitting: Gastroenterology

## 2022-06-28 ENCOUNTER — Other Ambulatory Visit: Payer: Self-pay | Admitting: Family Medicine

## 2022-07-03 ENCOUNTER — Other Ambulatory Visit: Payer: Self-pay | Admitting: Family Medicine

## 2022-07-23 ENCOUNTER — Ambulatory Visit (INDEPENDENT_AMBULATORY_CARE_PROVIDER_SITE_OTHER): Payer: 59 | Admitting: Family Medicine

## 2022-07-23 ENCOUNTER — Encounter: Payer: Self-pay | Admitting: Family Medicine

## 2022-07-23 VITALS — BP 94/60 | HR 79 | Ht 72.0 in | Wt 181.0 lb

## 2022-07-23 DIAGNOSIS — E785 Hyperlipidemia, unspecified: Secondary | ICD-10-CM

## 2022-07-23 DIAGNOSIS — E1169 Type 2 diabetes mellitus with other specified complication: Secondary | ICD-10-CM

## 2022-07-23 DIAGNOSIS — I959 Hypotension, unspecified: Secondary | ICD-10-CM | POA: Diagnosis not present

## 2022-07-23 DIAGNOSIS — M65312 Trigger thumb, left thumb: Secondary | ICD-10-CM | POA: Diagnosis not present

## 2022-07-23 DIAGNOSIS — M65319 Trigger thumb, unspecified thumb: Secondary | ICD-10-CM | POA: Insufficient documentation

## 2022-07-23 LAB — POCT GLYCOSYLATED HEMOGLOBIN (HGB A1C): HbA1c, POC (controlled diabetic range): 7.4 % — AB (ref 0.0–7.0)

## 2022-07-23 MED ORDER — ATORVASTATIN CALCIUM 40 MG PO TABS: 40.0000 mg | ORAL_TABLET | Freq: Every day | ORAL | 3 refills | Status: DC

## 2022-07-23 MED ORDER — OZEMPIC (1 MG/DOSE) 4 MG/3ML ~~LOC~~ SOPN: PEN_INJECTOR | SUBCUTANEOUS | 1 refills | Status: DC

## 2022-07-23 NOTE — Assessment & Plan Note (Signed)
Diabetes remains pretty well controlled for him.  Encouraged continued dietary changes.  Change back from trulicity to Ozempic.  Continue toujeo at current strength. Follow up in 6 months or sooner if needed.

## 2022-07-23 NOTE — Assessment & Plan Note (Signed)
Lab Results  Component Value Date   LDLCALC 37 12/08/2021  Tolerating atorvastatin at current strength.  Recommend continuation.

## 2022-07-23 NOTE — Patient Instructions (Signed)
Try adding voltaren gel to the thumb.  You may get this over the counter.   Add ozempic back on to replace trulicity.   See me again in 6 months.

## 2022-07-23 NOTE — Assessment & Plan Note (Signed)
Recommend trial of voltaren gel to thumb for a couple of weeks.  If not improving I have advised him to see Dr. Dianah Field to discuss injection.

## 2022-07-23 NOTE — Assessment & Plan Note (Signed)
BP is low but denies symptoms.  Encouraged to stay well hydrated.

## 2022-07-23 NOTE — Progress Notes (Signed)
Billy Armstrong - 76 y.o. male MRN [id number]  Date of birth: 08/04/1946  Subjective Chief Complaint  Patient presents with   Diabetes    HPI Billy Armstrong is a 76 y.o. male here today for follow up.   Reports that he is feeling pretty good.  History of poorly controlled diabetes.  Last A1c was 14.  He is prescribed Ozempic weekly and toujeo at 20 units daily.  He is also on synjardy.  Reports diet mains pretty consistent.Marland Kitchen  His weight is up nearly 20lbs since last visit.   Ran out of Ozempic and started using left over trulicity. He is checking glucose at home.  Remains on atorvastatin for management of co-existing HLD.  Denies side effects from this.   Having pain in the left thumb.  Locking sensation at times.    He is due for colon cancer screening again this year due to suboptimal prep with last colonoscopy.    ROS:  A comprehensive ROS was completed and negative except as noted per HPI  No Known Allergies  Past Medical History:  Diagnosis Date   Diabetes mellitus without complication ([place])    Hyperlipidemia    Seborrheic dermatitis of scalp 10/24/2018    Past Surgical History:  Procedure Laterality Date   COLONOSCOPY  05/11/2021   EYE SURGERY     KNEE SURGERY     LEG SURGERY     UPPER GASTROINTESTINAL ENDOSCOPY  05/11/2021    Social History   Socioeconomic History   Marital status: Widowed    Spouse name: Not on file   Number of children: 3   Years of education: Not on file   Highest education level: Not on file  Occupational History   Not on file  Tobacco Use   Smoking status: Never   Smokeless tobacco: Never  Vaping Use   Vaping Use: Never used  Substance and Sexual Activity   Alcohol use: Yes    Alcohol/week: 0.0 standard drinks of alcohol   Drug use: Yes   Sexual activity: Yes  Other Topics Concern   Not on file  Social History Narrative   Not on file   Social Determinants of Health   Financial Resource Strain: Not on file  Food Insecurity: Not on  file  Transportation Needs: Not on file  Physical Activity: Not on file  Stress: Not on file  Social Connections: Not on file    Family History  Problem Relation Age of Onset   Diabetes Mother    Cancer Father    Breast cancer Sister    Lung cancer Brother    Colon cancer Neg Hx    Rectal cancer Neg Hx    Esophageal cancer Neg Hx    Pancreatic cancer Neg Hx     Health Maintenance  Topic Date Due   OPHTHALMOLOGY EXAM  07/14/2022   COVID-19 Vaccine (1) 08/08/2022 (Originally 02/10/1946)   Zoster Vaccines- Shingrix (1 of 2) 10/22/2022 (Originally 08/12/1997)   INFLUENZA VACCINE  11/04/2022 (Originally 03/06/2022)   COLONOSCOPY (Pts 45-3yr Insurance coverage will need to be confirmed)  07/24/2023 (Originally 05/11/2022)   Diabetic kidney evaluation - eGFR measurement  12/09/2022   Diabetic kidney evaluation - Urine ACR  12/09/2022   HEMOGLOBIN A1C  01/22/2023   FOOT EXAM  03/23/2023   DTaP/Tdap/Td (2 - Td or Tdap) 02/26/2026   Pneumonia Vaccine 76 Years old  Completed   Hepatitis C Screening  Completed   HPV VACCINES  Aged Out     -----------------------------------------------------------------------------------------------------------------------------------------------------------------------------------------------------------------  Physical Exam BP 94/60 (BP Location: Left Arm, Patient Position: Sitting, Cuff Size: Normal)   Pulse 79   Ht 6' (1.829 m)   Wt 181 lb (82.1 kg)   SpO2 98%   BMI 24.55 kg/m   Physical Exam Constitutional:      Appearance: Normal appearance.  Eyes:     General: No scleral icterus. Cardiovascular:     Rate and Rhythm: Normal rate and regular rhythm.  Pulmonary:     Effort: Pulmonary effort is normal.     Breath sounds: Normal breath sounds.  Musculoskeletal:     Cervical back: Neck supple.  Neurological:     Mental Status: He is alert.  Psychiatric:        Mood and Affect: Mood normal.        Behavior: Behavior normal.      ------------------------------------------------------------------------------------------------------------------------------------------------------------------------------------------------------------------- Assessment and Plan  Diabetes mellitus ([place]) Diabetes remains pretty well controlled for him.  Encouraged continued dietary changes.  Change back from trulicity to Ozempic.  Continue toujeo at current strength. Follow up in 6 months or sooner if needed.   Hyperlipidemia associated with type 2 diabetes mellitus [facility]) Lab Results  Component Value Date   LDLCALC 37 12/08/2021  Tolerating atorvastatin at current strength.  Recommend continuation.   Hypotension BP is low but denies symptoms.  Encouraged to stay well hydrated.   Trigger finger of thumb Recommend trial of voltaren gel to thumb for a couple of weeks.  If not improving I have advised him to see Dr. Dianah Field to discuss injection.     Meds ordered this encounter  Medications   atorvastatin (LIPITOR) 40 MG tablet    Sig: Take 1 tablet (40 mg total) by mouth daily.    Dispense:  90 tablet    Refill:  3   Semaglutide, 1 MG/DOSE, (OZEMPIC, 1 MG/DOSE,) 4 MG/3ML SOPN    Sig: INJECT 1MG INTO THE SKIN ONCE A WEEK    Dispense:  9 mL    Refill:  1    No follow-ups on file.    This visit occurred during the SARS-CoV-2 public health emergency.  Safety protocols were in place, including screening questions prior to the visit, additional usage of staff PPE, and extensive cleaning of exam room while observing appropriate contact time as indicated for disinfecting solutions.

## 2022-08-07 LAB — HM DIABETES EYE EXAM

## 2022-12-14 ENCOUNTER — Other Ambulatory Visit: Payer: Self-pay | Admitting: Family Medicine

## 2022-12-14 DIAGNOSIS — E1169 Type 2 diabetes mellitus with other specified complication: Secondary | ICD-10-CM

## 2023-01-11 ENCOUNTER — Other Ambulatory Visit: Payer: Self-pay | Admitting: Family Medicine

## 2023-01-22 ENCOUNTER — Encounter: Payer: Self-pay | Admitting: Family Medicine

## 2023-01-22 ENCOUNTER — Ambulatory Visit (INDEPENDENT_AMBULATORY_CARE_PROVIDER_SITE_OTHER): Payer: 59 | Admitting: Family Medicine

## 2023-01-22 VITALS — BP 100/63 | HR 62 | Ht 72.0 in | Wt 182.0 lb

## 2023-01-22 DIAGNOSIS — Z125 Encounter for screening for malignant neoplasm of prostate: Secondary | ICD-10-CM

## 2023-01-22 DIAGNOSIS — E1169 Type 2 diabetes mellitus with other specified complication: Secondary | ICD-10-CM

## 2023-01-22 DIAGNOSIS — E119 Type 2 diabetes mellitus without complications: Secondary | ICD-10-CM | POA: Diagnosis not present

## 2023-01-22 DIAGNOSIS — E785 Hyperlipidemia, unspecified: Secondary | ICD-10-CM | POA: Diagnosis not present

## 2023-01-22 DIAGNOSIS — E559 Vitamin D deficiency, unspecified: Secondary | ICD-10-CM

## 2023-01-22 MED ORDER — ONETOUCH VERIO VI STRP: ORAL_STRIP | 12 refills | Status: DC

## 2023-01-22 MED ORDER — OZEMPIC (1 MG/DOSE) 4 MG/3ML ~~LOC~~ SOPN: PEN_INJECTOR | SUBCUTANEOUS | 1 refills | Status: DC

## 2023-01-22 NOTE — Assessment & Plan Note (Signed)
Update vitamin d levels.  

## 2023-01-22 NOTE — Progress Notes (Signed)
Billy Armstrong - 77 y.o. male MRN [id number]  Date of birth: January 25, 1946  Subjective Chief Complaint  Patient presents with   Diabetes    HPI Billy Armstrong is a 77 y.o. here today for follow up visit.    He reports that he is doing pretty well.   He continues on [place], [place] for management of diabetes.  He is tolerating medications well.  Blood sugars at home are looking pretty good with fasting cbg this morning 116.  Denies hypoglycemia.  Weight is stable.  He had eye exam earlier this year. Due for updated labs today.   Tolerating atorvastatin well for management of HLD.   ROS:  A comprehensive ROS was completed and negative except as noted per HPI  No Known Allergies  Past Medical History:  Diagnosis Date   Diabetes mellitus without complication (HCC)    Hyperlipidemia    Seborrheic dermatitis of scalp 10/24/2018    Past Surgical History:  Procedure Laterality Date   COLONOSCOPY  05/11/2021   EYE SURGERY     KNEE SURGERY     LEG SURGERY     UPPER GASTROINTESTINAL ENDOSCOPY  05/11/2021    Social History   Socioeconomic History   Marital status: Widowed    Spouse name: Not on file   Number of children: 3   Years of education: Not on file   Highest education level: 12th grade  Occupational History   Not on file  Tobacco Use   Smoking status: Never   Smokeless tobacco: Never  Vaping Use   Vaping Use: Never used  Substance and Sexual Activity   Alcohol use: Yes    Alcohol/week: 0.0 standard drinks of alcohol   Drug use: Yes   Sexual activity: Yes  Other Topics Concern   Not on file  Social History Narrative   Not on file   Social Determinants of Health   Financial Resource Strain: Patient Declined (01/21/2023)   Overall Financial Resource Strain (CARDIA)    Difficulty of Paying Living Expenses: Patient declined  Food Insecurity: Patient Declined (01/21/2023)   Hunger Vital Sign    Worried About Running Out of Food in the Last Year: Patient  declined    Ran Out of Food in the Last Year: Patient declined  Transportation Needs: No Transportation Needs (01/21/2023)   PRAPARE - Administrator, Civil Service (Medical): No    Lack of Transportation (Non-Medical): No  Physical Activity: Sufficiently Active (01/21/2023)   Exercise Vital Sign    Days of Exercise per Week: 5 days    Minutes of Exercise per Session: 30 min  Stress: No Stress Concern Present (01/21/2023)   [employer] of Occupational Health - Occupational Stress Questionnaire    Feeling of Stress : Only a little  Social Connections: Unknown (01/21/2023)   Social Connection and Isolation Panel [NHANES]    Frequency of Communication with Friends and Family: More than three times a week    Frequency of Social Gatherings with Friends and Family: More than three times a week    Attends Religious Services: 1 to 4 times per year    Active Member of [employer] or Organizations: Patient declined    Attends Banker Meetings: Not on file    Marital Status: Widowed    Family History  Problem Relation Age of Onset   Diabetes Mother    Cancer Father    Breast cancer Sister    Lung cancer Brother    Colon  cancer Neg Hx    Rectal cancer Neg Hx    Esophageal cancer Neg Hx    Pancreatic cancer Neg Hx     Health Maintenance  Topic Date Due   Zoster Vaccines- Shingrix (1 of 2) Never done   OPHTHALMOLOGY EXAM  07/14/2022   Diabetic kidney evaluation - eGFR measurement  12/09/2022   Diabetic kidney evaluation - Urine ACR  12/09/2022   HEMOGLOBIN A1C  01/22/2023   Colonoscopy  07/24/2023 (Originally 05/11/2022)   COVID-19 Vaccine (1) 07/24/2023 (Originally 02/10/1946)   INFLUENZA VACCINE  03/07/2023   FOOT EXAM  03/23/2023   DTaP/Tdap/Td (2 - Td or Tdap) 02/26/2026   Pneumonia Vaccine 34+ Years old  Completed   Hepatitis C Screening  Completed   HPV VACCINES  Aged Out      ----------------------------------------------------------------------------------------------------------------------------------------------------------------------------------------------------------------- Physical Exam BP 100/63 (BP Location: Left Arm, Patient Position: Sitting, Cuff Size: Normal)   Pulse 62   Ht 6' (1.829 m)   Wt 182 lb (82.6 kg)   SpO2 98%   BMI 24.68 kg/m   Physical Exam Constitutional:      Appearance: Normal appearance.  HENT:     Head: Normocephalic and atraumatic.  Cardiovascular:     Rate and Rhythm: Normal rate and regular rhythm.  Pulmonary:     Effort: Pulmonary effort is normal.     Breath sounds: Normal breath sounds.  Neurological:     Mental Status: He is alert.  Psychiatric:        Mood and Affect: Mood normal.        Behavior: Behavior normal.     ------------------------------------------------------------------------------------------------------------------------------------------------------------------------------------------------------------------- Assessment and Plan  Type 2 diabetes mellitus without complication, without long-term current use of insulin (HCC) Doing well with current medications.  Blood sugars at home are well controlled.  Continue current management.  Updating A1c and labs today.   Hyperlipidemia associated with type 2 diabetes mellitus ([facility]) Doing well with atorvastatin.  Updated lipid panel ordered today.   Vitamin D deficiency Update vitamin d levels.    No orders of the defined types were placed in this encounter.   No follow-ups on file.    This visit occurred during the SARS-CoV-2 public health emergency.  Safety protocols were in place, including screening questions prior to the visit, additional usage of staff PPE, and extensive cleaning of exam room while observing appropriate contact time as indicated for disinfecting solutions.

## 2023-01-22 NOTE — Assessment & Plan Note (Signed)
Doing well with atorvastatin.  Updated lipid panel ordered today.

## 2023-01-22 NOTE — Assessment & Plan Note (Signed)
Doing well with current medications.  Blood sugars at home are well controlled.  Continue current management.  Updating A1c and labs today.

## 2023-01-23 LAB — COMPLETE METABOLIC PANEL WITH GFR
AG Ratio: 1.7 (calc) (ref 1.0–2.5)
ALT: 16 U/L (ref 9–46)
AST: 17 U/L (ref 10–35)
Albumin: 4.3 g/dL (ref 3.6–5.1)
Alkaline phosphatase (APISO): 96 U/L (ref 35–144)
BUN/Creatinine Ratio: 34 (calc) — ABNORMAL HIGH (ref 6–22)
BUN: 21 mg/dL (ref 7–25)
CO2: 28 mmol/L (ref 20–32)
Calcium: 9.7 mg/dL (ref 8.6–10.3)
Chloride: 103 mmol/L (ref 98–110)
Creat: 0.62 mg/dL — ABNORMAL LOW (ref 0.70–1.28)
Globulin: 2.5 g/dL (calc) (ref 1.9–3.7)
Glucose, Bld: 94 mg/dL (ref 65–99)
Potassium: 4.4 mmol/L (ref 3.5–5.3)
Sodium: 140 mmol/L (ref 135–146)
Total Bilirubin: 0.6 mg/dL (ref 0.2–1.2)
Total Protein: 6.8 g/dL (ref 6.1–8.1)
eGFR: 100 mL/min/{1.73_m2} (ref >=60)

## 2023-01-23 LAB — CBC WITH DIFFERENTIAL/PLATELET
Absolute Monocytes: 913 cells/uL (ref 200–950)
Basophils Absolute: 62 cells/uL (ref 0–200)
Basophils Relative: 0.8 %
Eosinophils Absolute: 421 cells/uL (ref 15–500)
Eosinophils Relative: 5.4 %
HCT: 43.9 % (ref 38.5–50.0)
Hemoglobin: 14.7 g/dL (ref 13.2–17.1)
Lymphs Abs: 1802 cells/uL (ref 850–3900)
MCH: 32 pg (ref 27.0–33.0)
MCHC: 33.5 g/dL (ref 32.0–36.0)
MCV: 95.4 fL (ref 80.0–100.0)
MPV: 11 fL (ref 7.5–12.5)
Monocytes Relative: 11.7 %
Neutro Abs: 4602 cells/uL (ref 1500–7800)
Neutrophils Relative %: 59 %
Platelets: 200 10*3/uL (ref 140–400)
RBC: 4.6 10*6/uL (ref 4.20–5.80)
RDW: 12.3 % (ref 11.0–15.0)
Total Lymphocyte: 23.1 %
WBC: 7.8 10*3/uL (ref 3.8–10.8)

## 2023-01-23 LAB — MICROALBUMIN / CREATININE URINE RATIO
Creatinine, Urine: 68 mg/dL (ref 20–320)
Microalb, Ur: <0.2 mg/dL

## 2023-01-23 LAB — PSA: PSA: 1.23 ng/mL (ref <=4.00)

## 2023-01-23 LAB — LIPID PANEL W/REFLEX DIRECT LDL
Cholesterol: 91 mg/dL (ref <200)
HDL: 53 mg/dL (ref >=40)
LDL Cholesterol (Calc): 27 mg/dL (calc)
Non-HDL Cholesterol (Calc): 38 mg/dL (calc) (ref <130)
Total CHOL/HDL Ratio: 1.7 (calc) (ref <5.0)
Triglycerides: 39 mg/dL (ref <150)

## 2023-01-23 LAB — HEMOGLOBIN A1C
Hgb A1c MFr Bld: 7.3 % of total Hgb — ABNORMAL HIGH (ref <5.7)
Mean Plasma Glucose: 163 mg/dL
eAG (mmol/L): 9 mmol/L

## 2023-01-23 LAB — VITAMIN D 25 HYDROXY (VIT D DEFICIENCY, FRACTURES): Vit D, 25-Hydroxy: 76 ng/mL (ref 30–100)

## 2023-01-30 ENCOUNTER — Encounter: Payer: Self-pay | Admitting: Family Medicine

## 2023-04-01 ENCOUNTER — Other Ambulatory Visit: Payer: Self-pay | Admitting: Family Medicine

## 2023-04-01 DIAGNOSIS — E119 Type 2 diabetes mellitus without complications: Secondary | ICD-10-CM

## 2023-06-16 ENCOUNTER — Other Ambulatory Visit: Payer: Self-pay | Admitting: Family Medicine

## 2023-06-16 DIAGNOSIS — E1169 Type 2 diabetes mellitus with other specified complication: Secondary | ICD-10-CM

## 2023-06-26 ENCOUNTER — Other Ambulatory Visit: Payer: Self-pay | Admitting: Family Medicine

## 2023-06-26 DIAGNOSIS — E119 Type 2 diabetes mellitus without complications: Secondary | ICD-10-CM

## 2023-07-24 ENCOUNTER — Encounter: Payer: Self-pay | Admitting: Family Medicine

## 2023-07-24 ENCOUNTER — Telehealth: Payer: Self-pay

## 2023-07-24 ENCOUNTER — Ambulatory Visit (INDEPENDENT_AMBULATORY_CARE_PROVIDER_SITE_OTHER): Payer: 59 | Admitting: Family Medicine

## 2023-07-24 VITALS — BP 97/59 | HR 70 | Ht 72.0 in | Wt 186.8 lb

## 2023-07-24 DIAGNOSIS — Z7984 Long term (current) use of oral hypoglycemic drugs: Secondary | ICD-10-CM

## 2023-07-24 DIAGNOSIS — H9313 Tinnitus, bilateral: Secondary | ICD-10-CM | POA: Diagnosis not present

## 2023-07-24 DIAGNOSIS — H9319 Tinnitus, unspecified ear: Secondary | ICD-10-CM | POA: Insufficient documentation

## 2023-07-24 DIAGNOSIS — E785 Hyperlipidemia, unspecified: Secondary | ICD-10-CM | POA: Diagnosis not present

## 2023-07-24 DIAGNOSIS — E119 Type 2 diabetes mellitus without complications: Secondary | ICD-10-CM

## 2023-07-24 DIAGNOSIS — E1169 Type 2 diabetes mellitus with other specified complication: Secondary | ICD-10-CM

## 2023-07-24 LAB — POCT GLYCOSYLATED HEMOGLOBIN (HGB A1C): Hemoglobin A1C: 7.7 % — AB (ref 4.0–5.6)

## 2023-07-24 MED ORDER — OZEMPIC (1 MG/DOSE) 4 MG/3ML ~~LOC~~ SOPN
PEN_INJECTOR | SUBCUTANEOUS | 1 refills | Status: DC
Start: 2023-07-24 — End: 2023-07-24

## 2023-07-24 MED ORDER — OZEMPIC (2 MG/DOSE) 8 MG/3ML ~~LOC~~ SOPN: PEN_INJECTOR | SUBCUTANEOUS | 5 refills | Status: DC

## 2023-07-24 MED ORDER — SYNJARDY XR 25-1000 MG PO TB24: 1.0000 | ORAL_TABLET | Freq: Every day | ORAL | 1 refills | Status: DC

## 2023-07-24 MED ORDER — TOUJEO MAX SOLOSTAR 300 UNIT/ML ~~LOC~~ SOPN: 20.0000 [IU] | PEN_INJECTOR | Freq: Every day | SUBCUTANEOUS | 2 refills | Status: DC

## 2023-07-24 NOTE — Assessment & Plan Note (Addendum)
Diabetes remains pretty well controlled for him.  Encouraged continued dietary changes.  Increase Ozempic to 2mg  and continue Synjardy.  Continue toujeo at current strength. Follow up in 6 months or sooner if needed.

## 2023-07-24 NOTE — Assessment & Plan Note (Signed)
Doing well with atorvastatin.  Updated lipid panel ordered today.

## 2023-07-24 NOTE — Assessment & Plan Note (Signed)
Offered audiology/ent referral.  Declines for now.

## 2023-07-24 NOTE — Progress Notes (Signed)
Billy Armstrong - 77 y.o. male MRN [id number]  Date of birth: Sep 11, 1945  Subjective Chief Complaint  Patient presents with   Diabetes    Patient states ozempic is in need of PA .    HPI Billy Armstrong is a 77 y.o. male here today for follow up visit.  He reports that he is doing pretty well. Has some ringing in the ears.  Doesn't bother him too much.  Maybe some mild hearing loss with this.   Remains on Chrisney, Ozempic and toujeo for management of diabetes.  He denies side effects from medication including hypoglycemia.  A1c today is .  Increased at 7.7%  Tolerating atorvastatin for associated HLD.  No side effects with this at this time.   ROS:  A comprehensive ROS was completed and negative except as noted per HPI  No Known Allergies  Past Medical History:  Diagnosis Date   Diabetes mellitus without complication (HCC)    Hyperlipidemia    Seborrheic dermatitis of scalp 10/24/2018    Past Surgical History:  Procedure Laterality Date   COLONOSCOPY  05/11/2021   EYE SURGERY     KNEE SURGERY     LEG SURGERY     UPPER GASTROINTESTINAL ENDOSCOPY  05/11/2021    Social History   Socioeconomic History   Marital status: Widowed    Spouse name: Not on file   Number of children: 3   Years of education: Not on file   Highest education level: 12th grade  Occupational History   Not on file  Tobacco Use   Smoking status: Never   Smokeless tobacco: Never  Vaping Use   Vaping status: Never Used  Substance and Sexual Activity   Alcohol use: Yes    Alcohol/week: 0.0 standard drinks of alcohol   Drug use: Yes   Sexual activity: Yes  Other Topics Concern   Not on file  Social History Narrative   Not on file   Social Drivers of Health   Financial Resource Strain: Patient Declined (01/21/2023)   Overall Financial Resource Strain (CARDIA)    Difficulty of Paying Living Expenses: Patient declined  Food Insecurity: Patient Declined (01/21/2023)   Hunger Vital Sign    Worried  About Running Out of Food in the Last Year: Patient declined    Ran Out of Food in the Last Year: Patient declined  Transportation Needs: No Transportation Needs (01/21/2023)   PRAPARE - Administrator, Civil Service (Medical): No    Lack of Transportation (Non-Medical): No  Physical Activity: Sufficiently Active (01/21/2023)   Exercise Vital Sign    Days of Exercise per Week: 5 days    Minutes of Exercise per Session: 30 min  Stress: No Stress Concern Present (01/21/2023)   [employer] of Occupational Health - Occupational Stress Questionnaire    Feeling of Stress : Only a little  Social Connections: Unknown (01/21/2023)   Social Connection and Isolation Panel [NHANES]    Frequency of Communication with Friends and Family: More than three times a week    Frequency of Social Gatherings with Friends and Family: More than three times a week    Attends Religious Services: 1 to 4 times per year    Active Member of [employer] or Organizations: Patient declined    Attends Banker Meetings: Not on file    Marital Status: Widowed    Family History  Problem Relation Age of Onset   Diabetes Mother    Cancer Father  Breast cancer Sister    Lung cancer Brother    Colon cancer Neg Hx    Rectal cancer Neg Hx    Esophageal cancer Neg Hx    Pancreatic cancer Neg Hx     Health Maintenance  Topic Date Due   Zoster Vaccines- Shingrix (1 of 2) 08/12/1997   INFLUENZA VACCINE  Never done   COVID-19 Vaccine (1 - 2024-25 season) Never done   HEMOGLOBIN A1C  07/24/2023   Colonoscopy  07/24/2023 (Originally 05/11/2022)   OPHTHALMOLOGY EXAM  08/08/2023   Diabetic kidney evaluation - eGFR measurement  01/22/2024   Diabetic kidney evaluation - Urine ACR  01/22/2024   FOOT EXAM  07/23/2024   DTaP/Tdap/Td (2 - Td or Tdap) 02/26/2026   Pneumonia Vaccine 28+ Years old  Completed   Hepatitis C Screening  Completed   HPV VACCINES  Aged Out      ----------------------------------------------------------------------------------------------------------------------------------------------------------------------------------------------------------------- Physical Exam BP (!) 97/59 (BP Location: Left Arm, Patient Position: Sitting, Cuff Size: Normal)   Pulse 70   Ht 6' (1.829 m)   Wt 186 lb 12 oz (84.7 kg)   SpO2 98%   BMI 25.33 kg/m   Physical Exam Constitutional:      Appearance: Normal appearance.  HENT:     Head: Normocephalic and atraumatic.  Eyes:     General: No scleral icterus. Cardiovascular:     Rate and Rhythm: Normal rate and regular rhythm.  Pulmonary:     Effort: Pulmonary effort is normal.     Breath sounds: Normal breath sounds.  Neurological:     Mental Status: He is alert.  Psychiatric:        Mood and Affect: Mood normal.        Behavior: Behavior normal.     ------------------------------------------------------------------------------------------------------------------------------------------------------------------------------------------------------------------- Assessment and Plan  Hyperlipidemia associated with type 2 diabetes mellitus (HCC) Doing well with atorvastatin.  Updated lipid panel ordered today.   Tinnitus Offered audiology/ent referral.  Declines for now.   Diabetes mellitus (HCC) Diabetes remains pretty well controlled for him.  Encouraged continued dietary changes.  Increase Ozempic to 2mg  and continue Synjardy.  Continue toujeo at current strength. Follow up in 6 months or sooner if needed.    Meds ordered this encounter  Medications   insulin glargine, 2 Unit Dial, (TOUJEO MAX SOLOSTAR) 300 UNIT/ML Solostar Pen    Sig: Inject 20 Units into the skin daily.    Dispense:  6 mL    Refill:  2   DISCONTD: Semaglutide, 1 MG/DOSE, (OZEMPIC, 1 MG/DOSE,) 4 MG/3ML SOPN    Sig: INJECT 1 MG INTO THE SKIN ONE TIME PER WEEK    Dispense:  3 mL    Refill:  1    DX Code Needed   .   Empagliflozin-metFORMIN HCl ER (SYNJARDY XR) 25-1000 MG TB24    Sig: Take 1 tablet by mouth daily.    Dispense:  90 tablet    Refill:  1    DX Code Needed  .   Semaglutide, 2 MG/DOSE, (OZEMPIC, 2 MG/DOSE,) 8 MG/3ML SOPN    Sig: Inject 2mg  once per week.    Dispense:  3 mL    Refill:  5    Please fill 2mg  strength.    Return in about 6 months (around 01/22/2024) for Type 2 Diabetes.    This visit occurred during the SARS-CoV-2 public health emergency.  Safety protocols were in place, including screening questions prior to the visit, additional usage of staff PPE, and extensive cleaning  of exam room while observing appropriate contact time as indicated for disinfecting solutions.

## 2023-07-24 NOTE — Telephone Encounter (Signed)
Patient states Ozempic is requiring a PA .

## 2023-07-25 ENCOUNTER — Telehealth: Payer: Self-pay

## 2023-07-25 NOTE — Telephone Encounter (Signed)
Initiated Prior authorization [id number] (2 MG/DOSE) 8MG /3ML pen-injectors Via: Covermymeds Case/Key:[id number] Status: Pending as of 07/25/23 Reason: Notified Pt via: Mychart

## 2023-08-22 LAB — HM DIABETES EYE EXAM

## 2023-08-28 NOTE — Telephone Encounter (Signed)
In media tab, no PA needed at the time PA was submitted.

## 2023-08-28 NOTE — Telephone Encounter (Signed)
Per pharmacy - pt picked up 1mg  on 07/29/23 and cannot pick up a new prescription until 2/ 23/25

## 2023-09-15 ENCOUNTER — Other Ambulatory Visit: Payer: Self-pay | Admitting: Family Medicine

## 2023-10-22 ENCOUNTER — Other Ambulatory Visit: Payer: Self-pay | Admitting: Family Medicine

## 2023-10-22 DIAGNOSIS — E119 Type 2 diabetes mellitus without complications: Secondary | ICD-10-CM

## 2023-10-29 ENCOUNTER — Other Ambulatory Visit: Payer: Self-pay | Admitting: Family Medicine

## 2023-10-29 NOTE — Telephone Encounter (Unsigned)
 Copied from CRM [id number]. Topic: Clinical - Medication Refill >> Oct 29, 2023  8:54 AM Geroge Baseman wrote: Most Recent Primary Care Visit:  Provider: Everrett Coombe  Department: [facility]  Visit Type: OFFICE VISIT  Date: 07/24/2023  Medication: ondansetron (ZOFRAN ODT) 4 MG disintegrating tablet  Has the patient contacted their pharmacy? Yes (Agent: If no, request that the patient contact the pharmacy for the refill. If patient does not wish to contact the pharmacy document the reason why and proceed with request.) (Agent: If yes, when and what did the pharmacy advise?)  Is this the correct pharmacy for this prescription? Yes If no, delete pharmacy and type the correct one.  This is the patient's preferred pharmacy:  [facility] [phone] - [place], [place] - [address] [place] [address] Phone: [phone] Fax: [phone]   Has the prescription been filled recently? No  Is the patient out of the medication? Yes  Has the patient been seen for an appointment in the last year OR does the patient have an upcoming appointment? Yes  Can we respond through MyChart? No  Agent: Please be advised that Rx refills may take up to 3 business days. We ask that you follow-up with your pharmacy.

## 2023-10-29 NOTE — Telephone Encounter (Signed)
 Copied from CRM [id number]. Topic: Clinical - Medication Refill >> Oct 29, 2023  8:54 AM Geroge Baseman wrote: Most Recent Primary Care Visit:  Provider: Everrett Coombe  Department: [facility]  Visit Type: OFFICE VISIT  Date: 07/24/2023  Medication: ondansetron (ZOFRAN ODT) 4 MG disintegrating tablet  Has the patient contacted their pharmacy? Yes (Agent: If no, request that the patient contact the pharmacy for the refill. If patient does not wish to contact the pharmacy document the reason why and proceed with request.) (Agent: If yes, when and what did the pharmacy advise?)  Is this the correct pharmacy for this prescription? Yes If no, delete pharmacy and type the correct one.  This is the patient's preferred pharmacy:  [facility] [phone] - [place], [place] - [address] [place] [address] Phone: [phone] Fax: [phone]   Has the prescription been filled recently? No  Is the patient out of the medication? Yes  Has the patient been seen for an appointment in the last year OR does the patient have an upcoming appointment? Yes  Can we respond through MyChart? No  Agent: Please be advised that Rx refills may take up to 3 business days. We ask that you follow-up with your pharmacy.

## 2023-11-01 MED ORDER — ONDANSETRON 4 MG PO TBDP: 4.0000 mg | ORAL_TABLET | Freq: Three times a day (TID) | ORAL | 3 refills | Status: AC | PRN

## 2024-01-22 ENCOUNTER — Ambulatory Visit (INDEPENDENT_AMBULATORY_CARE_PROVIDER_SITE_OTHER): Payer: 59 | Admitting: Family Medicine

## 2024-01-22 ENCOUNTER — Encounter: Payer: Self-pay | Admitting: Family Medicine

## 2024-01-22 VITALS — BP 102/61 | HR 62 | Ht 72.0 in | Wt 177.0 lb

## 2024-01-22 DIAGNOSIS — Z860100 Personal history of colon polyps, unspecified: Secondary | ICD-10-CM

## 2024-01-22 DIAGNOSIS — E785 Hyperlipidemia, unspecified: Secondary | ICD-10-CM

## 2024-01-22 DIAGNOSIS — Z7985 Long-term (current) use of injectable non-insulin antidiabetic drugs: Secondary | ICD-10-CM

## 2024-01-22 DIAGNOSIS — Z125 Encounter for screening for malignant neoplasm of prostate: Secondary | ICD-10-CM

## 2024-01-22 DIAGNOSIS — Z7984 Long term (current) use of oral hypoglycemic drugs: Secondary | ICD-10-CM

## 2024-01-22 DIAGNOSIS — E119 Type 2 diabetes mellitus without complications: Secondary | ICD-10-CM

## 2024-01-22 DIAGNOSIS — E1169 Type 2 diabetes mellitus with other specified complication: Secondary | ICD-10-CM | POA: Diagnosis not present

## 2024-01-22 DIAGNOSIS — E559 Vitamin D deficiency, unspecified: Secondary | ICD-10-CM | POA: Diagnosis not present

## 2024-01-22 LAB — POCT GLYCOSYLATED HEMOGLOBIN (HGB A1C): HbA1c, POC (controlled diabetic range): 6.7 % (ref 0.0–7.0)

## 2024-01-22 MED ORDER — OZEMPIC (2 MG/DOSE) 8 MG/3ML ~~LOC~~ SOPN: PEN_INJECTOR | SUBCUTANEOUS | 5 refills | Status: DC

## 2024-01-22 MED ORDER — SYNJARDY XR 25-1000 MG PO TB24
1.0000 | ORAL_TABLET | Freq: Every day | ORAL | 1 refills | Status: AC
Start: 2024-01-22 — End: ?

## 2024-01-22 NOTE — Assessment & Plan Note (Signed)
 Discussed recommendations for repeat colonoscopy, however he declines to have this at this time.  He is aware of risks of not continuing screening.

## 2024-01-22 NOTE — Assessment & Plan Note (Signed)
Doing well with atorvastatin.  Updated lipid panel ordered today.

## 2024-01-22 NOTE — Progress Notes (Signed)
 Billy Armstrong - 78 y.o. male MRN [id number]  Date of birth: 12-Jan-1946  Subjective Chief Complaint  Patient presents with   Diabetes    HPI Billy Armstrong is a 78 y.o. male here today for follow up visit.   He reports that he is doing pretty well.   Diabetes is currently treated with combination of Ozempic  2mg /weekly, synjardy , and toujeo  20 units daily.  POC A1c today is 6.7%.  He does not really follow any dietary restrictions.  He is moderately active.  Tolerating atorvastatin  well for associated HLD and elevated ASCVD risk.   He is overdue for colon cancer screening as well.  History of adenomatous polyps.    ROS:  A comprehensive ROS was completed and negative except as noted per HPI   No Known Allergies  Past Medical History:  Diagnosis Date   Diabetes mellitus without complication (HCC)    Hyperlipidemia    Seborrheic dermatitis of scalp 10/24/2018    Past Surgical History:  Procedure Laterality Date   COLONOSCOPY  05/11/2021   EYE SURGERY     KNEE SURGERY     LEG SURGERY     UPPER GASTROINTESTINAL ENDOSCOPY  05/11/2021    Social History   Socioeconomic History   Marital status: Widowed    Spouse name: Not on file   Number of children: 3   Years of education: Not on file   Highest education level: 12th grade  Occupational History   Not on file  Tobacco Use   Smoking status: Never   Smokeless tobacco: Never  Vaping Use   Vaping status: Never Used  Substance and Sexual Activity   Alcohol use: Yes    Alcohol/week: 0.0 standard drinks of alcohol   Drug use: Yes   Sexual activity: Yes  Other Topics Concern   Not on file  Social History Narrative   Not on file   Social Drivers of Health   Financial Resource Strain: Low Risk  (01/22/2024)   Overall Financial Resource Strain (CARDIA)    Difficulty of Paying Living Expenses: Not hard at all  Food Insecurity: No Food Insecurity (01/22/2024)   Hunger Vital Sign    Worried About Running Out of Food in the  Last Year: Never true    Ran Out of Food in the Last Year: Never true  Transportation Needs: No Transportation Needs (01/22/2024)   PRAPARE - Administrator, Civil Service (Medical): No    Lack of Transportation (Non-Medical): No  Physical Activity: Insufficiently Active (01/22/2024)   Exercise Vital Sign    Days of Exercise per Week: 4 days    Minutes of Exercise per Session: 30 min  Stress: No Stress Concern Present (01/22/2024)   [employer] of Occupational Health - Occupational Stress Questionnaire    Feeling of Stress: Only a little  Social Connections: Moderately Integrated (01/22/2024)   Social Connection and Isolation Panel    Frequency of Communication with Friends and Family: More than three times a week    Frequency of Social Gatherings with Friends and Family: More than three times a week    Attends Religious Services: 1 to 4 times per year    Active Member of [employer] or Organizations: Yes    Attends Banker Meetings: More than 4 times per year    Marital Status: Widowed    Family History  Problem Relation Age of Onset   Diabetes Mother    Cancer Father    Breast cancer Sister  Lung cancer Brother    Colon cancer Neg Hx    Rectal cancer Neg Hx    Esophageal cancer Neg Hx    Pancreatic cancer Neg Hx     Health Maintenance  Topic Date Due   Zoster Vaccines- Shingrix (1 of 2) 08/12/1997   Colonoscopy  05/11/2022   COVID-19 Vaccine (1 - 2024-25 season) Never done   OPHTHALMOLOGY EXAM  08/08/2023   Diabetic kidney evaluation - eGFR measurement  01/22/2024   Diabetic kidney evaluation - Urine ACR  01/22/2024   INFLUENZA VACCINE  03/06/2024   FOOT EXAM  07/23/2024   HEMOGLOBIN A1C  07/23/2024   DTaP/Tdap/Td (2 - Td or Tdap) 02/26/2026   Pneumococcal Vaccine: 50+ Years  Completed   Hepatitis C Screening  Completed   HPV VACCINES  Aged Out   Meningococcal B Vaccine  Aged Out      ----------------------------------------------------------------------------------------------------------------------------------------------------------------------------------------------------------------- Physical Exam BP 102/61 (BP Location: Left Arm, Patient Position: Sitting, Cuff Size: Normal)   Pulse 62   Ht 6' (1.829 m)   Wt 177 lb (80.3 kg)   SpO2 99%   BMI 24.01 kg/m   Physical Exam Constitutional:      Appearance: Normal appearance.  HENT:     Head: Normocephalic and atraumatic.   Eyes:     General: No scleral icterus.   Cardiovascular:     Rate and Rhythm: Normal rate and regular rhythm.  Pulmonary:     Effort: Pulmonary effort is normal.     Breath sounds: Normal breath sounds.   Neurological:     Mental Status: He is alert.   Psychiatric:        Mood and Affect: Mood normal.        Behavior: Behavior normal.     ------------------------------------------------------------------------------------------------------------------------------------------------------------------------------------------------------------------- Assessment and Plan  Hyperlipidemia associated with type 2 diabetes mellitus (HCC) Doing well with atorvastatin .  Updated lipid panel ordered today.   History of colon polyps Discussed recommendations for repeat colonoscopy, however he declines to have this at this time.  He is aware of risks of not continuing screening.    Meds ordered this encounter  Medications   Empagliflozin -metFORMIN  HCl ER (SYNJARDY  XR) 25-1000 MG TB24    Sig: Take 1 tablet by mouth daily.    Dispense:  90 tablet    Refill:  1    DX Code Needed  .   Semaglutide , 2 MG/DOSE, (OZEMPIC , 2 MG/DOSE,) 8 MG/3ML SOPN    Sig: Inject 2mg  once per week.    Dispense:  3 mL    Refill:  5    Please fill 2mg  strength.    No follow-ups on file.

## 2024-01-23 LAB — CBC WITH DIFFERENTIAL/PLATELET
Basophils Absolute: 0.1 10*3/uL (ref 0.0–0.2)
Basos: 1 %
EOS (ABSOLUTE): 0.2 10*3/uL (ref 0.0–0.4)
Eos: 4 %
Hematocrit: 39.3 % (ref 37.5–51.0)
Hemoglobin: 13.1 g/dL (ref 13.0–17.7)
Immature Grans (Abs): 0 10*3/uL (ref 0.0–0.1)
Immature Granulocytes: 0 %
Lymphocytes Absolute: 1.9 10*3/uL (ref 0.7–3.1)
Lymphs: 31 %
MCH: 32.2 pg (ref 26.6–33.0)
MCHC: 33.3 g/dL (ref 31.5–35.7)
MCV: 97 fL (ref 79–97)
Monocytes Absolute: 0.7 10*3/uL (ref 0.1–0.9)
Monocytes: 11 %
Neutrophils Absolute: 3.4 10*3/uL (ref 1.4–7.0)
Neutrophils: 53 %
Platelets: 199 10*3/uL (ref 150–450)
RBC: 4.07 x10E6/uL — ABNORMAL LOW (ref 4.14–5.80)
RDW: 12.5 % (ref 11.6–15.4)
WBC: 6.3 10*3/uL (ref 3.4–10.8)

## 2024-01-23 LAB — LIPID PANEL
Chol/HDL Ratio: 2 ratio (ref 0.0–5.0)
Cholesterol, Total: 101 mg/dL (ref 100–199)
HDL: 50 mg/dL (ref >39)
LDL Chol Calc (NIH): 41 mg/dL (ref 0–99)
Triglycerides: 34 mg/dL (ref 0–149)
VLDL Cholesterol Cal: 10 mg/dL (ref 5–40)

## 2024-01-23 LAB — MICROALBUMIN / CREATININE URINE RATIO
Creatinine, Urine: 96.9 mg/dL
Microalb/Creat Ratio: 5 mg/g{creat} (ref 0–29)
Microalbumin, Urine: 4.7 ug/mL

## 2024-01-23 LAB — CMP14+EGFR
ALT: 15 IU/L (ref 0–44)
AST: 20 IU/L (ref 0–40)
Albumin: 4 g/dL (ref 3.8–4.8)
Alkaline Phosphatase: 104 IU/L (ref 44–121)
BUN/Creatinine Ratio: 48 — ABNORMAL HIGH (ref 10–24)
BUN: 30 mg/dL — ABNORMAL HIGH (ref 8–27)
Bilirubin Total: 0.5 mg/dL (ref 0.0–1.2)
CO2: 21 mmol/L (ref 20–29)
Calcium: 9.4 mg/dL (ref 8.6–10.2)
Chloride: 105 mmol/L (ref 96–106)
Creatinine, Ser: 0.62 mg/dL — ABNORMAL LOW (ref 0.76–1.27)
Globulin, Total: 2.3 g/dL (ref 1.5–4.5)
Glucose: 76 mg/dL (ref 70–99)
Potassium: 4 mmol/L (ref 3.5–5.2)
Sodium: 142 mmol/L (ref 134–144)
Total Protein: 6.3 g/dL (ref 6.0–8.5)
eGFR: 99 mL/min/{1.73_m2} (ref >59)

## 2024-01-23 LAB — PSA: Prostate Specific Ag, Serum: 1.4 ng/mL (ref 0.0–4.0)

## 2024-01-31 ENCOUNTER — Ambulatory Visit: Payer: Self-pay | Admitting: Family Medicine

## 2024-03-10 ENCOUNTER — Other Ambulatory Visit: Payer: Self-pay | Admitting: Family Medicine

## 2024-03-17 ENCOUNTER — Other Ambulatory Visit: Payer: Self-pay | Admitting: Family Medicine

## 2024-07-23 ENCOUNTER — Ambulatory Visit (INDEPENDENT_AMBULATORY_CARE_PROVIDER_SITE_OTHER): Admitting: Family Medicine

## 2024-07-23 ENCOUNTER — Encounter: Payer: Self-pay | Admitting: Family Medicine

## 2024-07-23 VITALS — BP 121/79 | HR 73 | Temp 97.6°F | Resp 18 | Ht 73.0 in | Wt 174.0 lb

## 2024-07-23 DIAGNOSIS — Z1211 Encounter for screening for malignant neoplasm of colon: Secondary | ICD-10-CM

## 2024-07-23 DIAGNOSIS — Z7984 Long term (current) use of oral hypoglycemic drugs: Secondary | ICD-10-CM

## 2024-07-23 DIAGNOSIS — E1169 Type 2 diabetes mellitus with other specified complication: Secondary | ICD-10-CM

## 2024-07-23 DIAGNOSIS — E785 Hyperlipidemia, unspecified: Secondary | ICD-10-CM

## 2024-07-23 DIAGNOSIS — E119 Type 2 diabetes mellitus without complications: Secondary | ICD-10-CM | POA: Diagnosis not present

## 2024-07-23 DIAGNOSIS — Z794 Long term (current) use of insulin: Secondary | ICD-10-CM | POA: Diagnosis not present

## 2024-07-23 LAB — POCT GLYCOSYLATED HEMOGLOBIN (HGB A1C): Hemoglobin A1C: 9.5 % — AB (ref 4.0–5.6)

## 2024-07-23 MED ORDER — SEMAGLUTIDE (1 MG/DOSE) 4 MG/3ML ~~LOC~~ SOPN: 1.0000 mg | PEN_INJECTOR | SUBCUTANEOUS | 0 refills | Status: AC

## 2024-07-23 MED ORDER — ONETOUCH VERIO VI STRP: ORAL_STRIP | 12 refills | Status: AC

## 2024-07-23 MED ORDER — TOUJEO MAX SOLOSTAR 300 UNIT/ML ~~LOC~~ SOPN: 20.0000 [IU] | PEN_INJECTOR | Freq: Every day | SUBCUTANEOUS | 2 refills | Status: AC

## 2024-07-23 MED ORDER — SEMAGLUTIDE (2 MG/DOSE) 8 MG/3ML ~~LOC~~ SOPN: 2.0000 mg | PEN_INJECTOR | SUBCUTANEOUS | 2 refills | Status: AC

## 2024-07-23 NOTE — Assessment & Plan Note (Signed)
 Doing well with atorvastatin .  Recommend continuaton.

## 2024-07-23 NOTE — Progress Notes (Signed)
 Billy Armstrong - 78 y.o. male MRN [id number]  Date of birth: 1946-01-29  Subjective Chief Complaint  Patient presents with   Diabetes    Last A1C was 6.7 on 01/22/2024 (todays reading 9.5)    HPI Billy Armstrong is a 78 y.o. male here today for annual exam.   He reports that he is doing ok.   He continues on Ozempic , Synjardy  and toujeo  for diabetes management.  He lost rx coverage due to retiring recently and has been off Ozempic  and insulin.  He did recently sign up for medicare part d but this is not effective until Jan 1.  A1c today is 9.5%.   Tolerating atorvastatin  well for management of co morbid HLD.    Overdue for colon cancer screening. Refused referral last year but is open to having this done now.   ROS:  A comprehensive ROS was completed and negative except as noted per HPI  Allergies[1]  Past Medical History:  Diagnosis Date   Diabetes mellitus without complication (HCC)    Hyperlipidemia    Seborrheic dermatitis of scalp 10/24/2018    Past Surgical History:  Procedure Laterality Date   COLONOSCOPY  05/11/2021   EYE SURGERY     KNEE SURGERY     LEG SURGERY     UPPER GASTROINTESTINAL ENDOSCOPY  05/11/2021    Social History   Socioeconomic History   Marital status: Widowed    Spouse name: Not on file   Number of children: 3   Years of education: Not on file   Highest education level: 12th grade  Occupational History   Not on file  Tobacco Use   Smoking status: Never   Smokeless tobacco: Never  Vaping Use   Vaping status: Never Used  Substance and Sexual Activity   Alcohol use: Yes    Alcohol/week: 0.0 standard drinks of alcohol   Drug use: Yes   Sexual activity: Yes  Other Topics Concern   Not on file  Social History Narrative   Not on file   Social Drivers of Health   Tobacco Use: Low Risk (07/23/2024)   Patient History    Smoking Tobacco Use: Never    Smokeless Tobacco Use: Never    Passive Exposure: Not on file  Financial Resource Strain:  Low Risk (01/22/2024)   Overall Financial Resource Strain (CARDIA)    Difficulty of Paying Living Expenses: Not hard at all  Food Insecurity: No Food Insecurity (01/22/2024)   Epic    Worried About Programme Researcher, Broadcasting/film/video in the Last Year: Never true    Ran Out of Food in the Last Year: Never true  Transportation Needs: No Transportation Needs (01/22/2024)   Epic    Lack of Transportation (Medical): No    Lack of Transportation (Non-Medical): No  Physical Activity: Insufficiently Active (01/22/2024)   Exercise Vital Sign    Days of Exercise per Week: 4 days    Minutes of Exercise per Session: 30 min  Stress: No Stress Concern Present (01/22/2024)   [employer] of Occupational Health - Occupational Stress Questionnaire    Feeling of Stress: Only a little  Social Connections: Moderately Integrated (01/22/2024)   Social Connection and Isolation Panel    Frequency of Communication with Friends and Family: More than three times a week    Frequency of Social Gatherings with Friends and Family: More than three times a week    Attends Religious Services: 1 to 4 times per year    Active Member of [employer]  or Organizations: Yes    Attends Banker Meetings: More than 4 times per year    Marital Status: Widowed  Depression (PHQ2-9): Low Risk (01/22/2024)   Depression (PHQ2-9)    PHQ-2 Score: 0  Alcohol Screen: Low Risk (01/22/2024)   Alcohol Screen    Last Alcohol Screening Score (AUDIT): 1  Housing: Low Risk (01/22/2024)   Epic    Unable to Pay for Housing in the Last Year: No    Number of Times Moved in the Last Year: 0    Homeless in the Last Year: No  Utilities: Not on file  Health Literacy: Adequate Health Literacy (01/22/2024)   B1300 Health Literacy    Frequency of need for help with medical instructions: Rarely    Family History  Problem Relation Age of Onset   Diabetes Mother    Cancer Father    Breast cancer Sister    Lung cancer Brother    Colon cancer Neg Hx     Rectal cancer Neg Hx    Esophageal cancer Neg Hx    Pancreatic cancer Neg Hx     Health Maintenance  Topic Date Due   Medicare Annual Wellness (AWV)  Never done   Zoster Vaccines- Shingrix (1 of 2) 08/12/1997   Colonoscopy  05/11/2022   Influenza Vaccine  Never done   COVID-19 Vaccine (1 - 2025-26 season) Never done   FOOT EXAM  07/23/2024   HEMOGLOBIN A1C  07/23/2024   OPHTHALMOLOGY EXAM  08/21/2024   Diabetic kidney evaluation - eGFR measurement  01/21/2025   Diabetic kidney evaluation - Urine ACR  01/21/2025   DTaP/Tdap/Td (2 - Td or Tdap) 02/26/2026   Pneumococcal Vaccine: 50+ Years  Completed   Hepatitis C Screening  Completed   Meningococcal B Vaccine  Aged Out     ----------------------------------------------------------------------------------------------------------------------------------------------------------------------------------------------------------------- Physical Exam BP 121/79 (BP Location: Left Arm, Patient Position: Sitting, Cuff Size: Normal)   Pulse 73   Temp 97.6 F (36.4 C) (Oral)   Resp 18   Ht 6' 1 (1.854 m)   Wt 174 lb 0.6 oz (78.9 kg)   SpO2 99%   BMI 22.96 kg/m   Physical Exam Constitutional:      Appearance: Normal appearance.  Eyes:     General: No scleral icterus. Cardiovascular:     Rate and Rhythm: Normal rate and regular rhythm.  Pulmonary:     Effort: Pulmonary effort is normal.     Breath sounds: Normal breath sounds.  Neurological:     Mental Status: He is alert.  Psychiatric:        Mood and Affect: Mood normal.        Behavior: Behavior normal.     ------------------------------------------------------------------------------------------------------------------------------------------------------------------------------------------------------------------- Assessment and Plan  Type 2 diabetes mellitus without complication, without long-term current use of insulin (HCC) Diabetes is not well controlled.  Part D  coverage starts at the first of the year, so he will check on prices at that time.  He does have some insulin left which he will continue.  Restart ozempic  at 1mg  if he is able to restart this.   Hyperlipidemia associated with type 2 diabetes mellitus (HCC) Doing well with atorvastatin .  Recommend continuaton.    Meds ordered this encounter  Medications   glucose blood (ONETOUCH VERIO) test strip    Sig: USE TO TEST EVERY DAY    Dispense:  100 strip    Refill:  12   insulin glargine , 2 Unit Dial, (TOUJEO  MAX SOLOSTAR) 300 UNIT/ML  Solostar Pen    Sig: Inject 20 Units into the skin daily. Per insurance - no prior auth required. Rx is a covered med.    Dispense:  6 mL    Refill:  2   Semaglutide , 1 MG/DOSE, 4 MG/3ML SOPN    Sig: Inject 1 mg into the skin once a week. Increase to 2mg  after 4 weeks    Dispense:  3 mL    Refill:  0   Semaglutide , 2 MG/DOSE, 8 MG/3ML SOPN    Sig: Inject 2 mg as directed once a week.    Dispense:  3 mL    Refill:  2    Return in about 4 months (around 11/21/2024) for Type 2 Diabetes.        [1] No Known Allergies

## 2024-07-23 NOTE — Assessment & Plan Note (Signed)
 Diabetes is not well controlled.  Part D coverage starts at the first of the year, so he will check on prices at that time.  He does have some insulin left which he will continue.  Restart ozempic  at 1mg  if he is able to restart this.

## 2024-09-09 ENCOUNTER — Encounter: Payer: Self-pay | Admitting: Gastroenterology

## 2024-11-23 ENCOUNTER — Ambulatory Visit: Admitting: Family Medicine
